# Patient Record
Sex: Male | Born: 1960 | Race: White | Hispanic: No | Marital: Married | Smoking: Never smoker
Health system: Southern US, Community
[De-identification: ages and names within clinical notes are randomized; demographics above are authoritative.]

## PROBLEM LIST (undated history)

## (undated) DIAGNOSIS — T7840XA Allergy, unspecified, initial encounter: Secondary | ICD-10-CM

## (undated) DIAGNOSIS — F419 Anxiety disorder, unspecified: Secondary | ICD-10-CM

## (undated) DIAGNOSIS — M1990 Unspecified osteoarthritis, unspecified site: Secondary | ICD-10-CM

## (undated) DIAGNOSIS — I10 Essential (primary) hypertension: Secondary | ICD-10-CM

## (undated) HISTORY — PX: VASECTOMY: SHX75

## (undated) HISTORY — DX: Essential (primary) hypertension: I10

## (undated) HISTORY — DX: Allergy, unspecified, initial encounter: T78.40XA

## (undated) HISTORY — PX: SPINE SURGERY: SHX786

## (undated) HISTORY — DX: Unspecified osteoarthritis, unspecified site: M19.90

## (undated) HISTORY — PX: CERVICAL FUSION: SHX112

## (undated) HISTORY — DX: Anxiety disorder, unspecified: F41.9

---

## 1990-06-27 HISTORY — PX: OTHER SURGICAL HISTORY: SHX169

## 2015-08-26 ENCOUNTER — Ambulatory Visit: Payer: Self-pay | Admitting: Physician Assistant

## 2015-08-31 ENCOUNTER — Ambulatory Visit: Payer: Self-pay | Admitting: Physician Assistant

## 2015-09-14 ENCOUNTER — Ambulatory Visit (INDEPENDENT_AMBULATORY_CARE_PROVIDER_SITE_OTHER): Payer: 59 | Admitting: Physician Assistant

## 2015-09-14 ENCOUNTER — Encounter: Payer: Self-pay | Admitting: Physician Assistant

## 2015-09-14 VITALS — BP 160/70 | HR 68 | Temp 98.1°F | Resp 16 | Wt 293.6 lb

## 2015-09-14 DIAGNOSIS — I10 Essential (primary) hypertension: Secondary | ICD-10-CM | POA: Insufficient documentation

## 2015-09-14 DIAGNOSIS — N411 Chronic prostatitis: Secondary | ICD-10-CM | POA: Insufficient documentation

## 2015-09-14 DIAGNOSIS — F329 Major depressive disorder, single episode, unspecified: Secondary | ICD-10-CM | POA: Diagnosis not present

## 2015-09-14 DIAGNOSIS — Z136 Encounter for screening for cardiovascular disorders: Secondary | ICD-10-CM

## 2015-09-14 DIAGNOSIS — Z13220 Encounter for screening for lipoid disorders: Secondary | ICD-10-CM

## 2015-09-14 DIAGNOSIS — Z125 Encounter for screening for malignant neoplasm of prostate: Secondary | ICD-10-CM

## 2015-09-14 DIAGNOSIS — Z0000 Encounter for general adult medical examination without abnormal findings: Secondary | ICD-10-CM | POA: Diagnosis not present

## 2015-09-14 DIAGNOSIS — M2550 Pain in unspecified joint: Secondary | ICD-10-CM

## 2015-09-14 DIAGNOSIS — F32A Depression, unspecified: Secondary | ICD-10-CM | POA: Insufficient documentation

## 2015-09-14 DIAGNOSIS — M6530 Trigger finger, unspecified finger: Secondary | ICD-10-CM | POA: Insufficient documentation

## 2015-09-14 MED ORDER — FLUOXETINE HCL 20 MG PO CAPS: 20.0000 mg | ORAL_CAPSULE | Freq: Every day | ORAL | Status: DC

## 2015-09-14 NOTE — Progress Notes (Signed)
Patient: Kyle Pollard, Male    DOB: 1961-02-02, 55 y.o.   MRN: [id number] Visit Date: 09/14/2015  Today's Provider: [name], PA-C   Chief Complaint  Patient presents with  . Establish Care   Subjective:    Establish Care:  Kyle Pollard is a 55 y.o. male who presents today to establish care. He feels well. Has frequent urination since he was 55 years old. Used to see a urologist. He is concern about having joint pain when he sits for like 15 minutes, most of the time it gets better by walking but is more frequent now. He also needs a refill for Fluoxetine. He reports that it helps him sleep better and takes one every day.  Tdap:2016 -----------------------------------------------------------------   Review of Systems  Constitutional: Negative.   HENT: Positive for congestion, sinus pressure, sneezing and voice change. Negative for ear pain, postnasal drip, rhinorrhea and sore throat.   Eyes: Positive for itching.  Respiratory: Negative.   Cardiovascular: Negative.   Gastrointestinal: Negative.   Endocrine: Positive for polyuria.  Genitourinary: Positive for frequency. Negative for decreased urine volume, discharge, penile swelling, scrotal swelling, penile pain and testicular pain.  Musculoskeletal: Positive for myalgias and arthralgias.  Skin: Negative.   Allergic/Immunologic: Positive for environmental allergies.  Neurological: Negative.   Hematological: Negative.   Psychiatric/Behavioral: Positive for sleep disturbance. Negative for dysphoric mood and decreased concentration. The patient is nervous/anxious.     Social History      He  reports that he has been smoking.  He does not have any smokeless tobacco history on file. He reports that he drinks alcohol. He reports that he does not use illicit drugs.       Social History   Social History  . Marital Status: Married    Spouse Name: N/A  . Number of Children: N/A  . Years of Education: N/A    Social History Main Topics  . Smoking status: Current Every Day Smoker  . Smokeless tobacco: None  . Alcohol Use: Yes  . Drug Use: No  . Sexual Activity: Not Asked   Other Topics Concern  . None   Social History Narrative  . None    Past Medical History  Diagnosis Date  . Allergy   . Hypertension      Patient Active Problem List   Diagnosis Date Noted  . Clinical depression 09/14/2015  . BP (high blood pressure) 09/14/2015  . Triggering of digit 09/14/2015    Past Surgical History  Procedure Laterality Date  . Broken leg  1992    Family History        Family Status  Relation Status Death Age  . Mother Alive   . Father Deceased         His family history is not on file.    Allergies  Allergen Reactions  . Penicillins Rash    Previous Medications   ASPIRIN EC 81 MG TABLET    Take by mouth. Reported on 09/14/2015   FLUOXETINE (PROZAC) 20 MG CAPSULE    TAKE ONE CAPSULE BY MOUTH EVERY DAY MUST HAVE APPT FOR MORE REFILLS   LOSARTAN-HYDROCHLOROTHIAZIDE (HYZAAR) 50-12.5 MG TABLET    Take by mouth.   MULTIPLE VITAMIN (MULTI-VITAMINS) TABS    Take by mouth.    Patient Care Team: [name], PA-C as PCP - General (Family Medicine)     Objective:   Vitals: BP 160/70 mmHg  Pulse 68  Temp(Src) 98.1 F (  36.7 C) (Oral)  Resp 16  Wt 293 lb 9.6 oz (133.176 kg)  SpO2 96%   Physical Exam  Constitutional: He is oriented to person, place, and time. He appears well-developed and well-nourished.  HENT:  Head: Normocephalic and atraumatic.  Right Ear: External ear normal.  Left Ear: External ear normal.  Nose: Nose normal.  Mouth/Throat: Oropharynx is clear and moist.  Eyes: Conjunctivae and EOM are normal. Pupils are equal, round, and reactive to light. Right eye exhibits no discharge.  Neck: Normal range of motion. Neck supple. No tracheal deviation present. No thyromegaly present.  Cardiovascular: Normal rate, regular rhythm, normal heart sounds and  intact distal pulses.   No murmur heard. Pulmonary/Chest: Effort normal and breath sounds normal. No respiratory distress. He has no wheezes. He has no rales. He exhibits no tenderness.  Abdominal: Soft. He exhibits no distension and no mass. There is no tenderness. There is no rebound and no guarding.  Genitourinary:  Patient deferred  Musculoskeletal: Normal range of motion. He exhibits no edema or tenderness.  Lymphadenopathy:    He has no cervical adenopathy.  Neurological: He is alert and oriented to person, place, and time. He has normal reflexes. No cranial nerve deficit. He exhibits normal muscle tone. Coordination normal.  Skin: Skin is warm and dry. No rash noted. No erythema.  Psychiatric: He has a normal mood and affect. His behavior is normal. Judgment and thought content normal.     Depression Screen No flowsheet data found.    Assessment & Plan:     Routine Health Maintenance and Physical Exam   1. Annual physical exam Physical exam today was normal. Patient deferred digital rectal exam and prostate exam. We will check labs as below and follow-up pending lab results. If labs are within normal limits and stable we will not need to have them repeated for one year until his next annual physical exam. He is to call the office in the meantime if he has any acute issues, questions or concerns. - TSH - CBC w/Diff/Platelet - Comprehensive Metabolic Panel (CMET)  2. Chronic prostatitis States he has had issues with urinary urgency and frequency since his early 33s. He has seen multiple urologists and states that he never saw any true benefit. He states he has been put on medications to decrease urine frequency but these medications did not help. He has been on antibiotics often for this as well stating that is the only thing that helps during the time he is on the antibiotics but as soon as he comes off of them the symptoms return. He states that he just lives with that now and  will treat whenever he has acute exacerbations but does not wish to see urology. He does try timed toileting to help with urine frequency and urgency symptoms. He is to call if he has any acute exacerbations.  3. Depression Stable. Diagnoses pulled to refill Prozac as below. Continue current medical treatment plan. - FLUoxetine (PROZAC) 20 MG capsule; Take 1 capsule (20 mg total) by mouth at bedtime.  Dispense: 90 capsule; Refill: 3  4. Encounter for lipid screening for cardiovascular disease Will check cholesterol as below being that he has not had labs checked in a while. I will follow-up with him pending these results. - Lipid panel  5. Multiple joint pain I do feel that his joint complaints seem most consistent with osteoarthritis. He states that he used to be an avid runner which would've accelerated his arthritis. I will  check labs as below to rule out any autoimmune cause for the joint pains. If labs are stable will treat as osteoarthritis. May get imaging in the future if he desires or if symptoms worsen. - Rheumatoid Factor - ANA w/Reflex if Positive - Sedimentation rate  6. Encounter for prostate cancer screening We'll check PSA since he deferred prostate and digital rectal exam today. He states that if his PSA is elevated he will then get that checked. He states that he will wait and get it done next year instead so that he can "prepare" better. - PSA   Exercise Activities and Dietary recommendations Goals    None       There is no immunization history on file for this patient.  Health Maintenance  Topic Date Due  . Hepatitis C Screening  04-Dec-1960  . HIV Screening  10/12/1973  . TETANUS/TDAP  10/12/1977  . COLONOSCOPY  10/12/2008  . INFLUENZA VACCINE  01/26/2015      Discussed health benefits of physical activity, and encouraged him to engage in regular exercise appropriate for his age and condition.      --------------------------------------------------------------------

## 2015-09-15 LAB — LIPID PANEL
CHOL/HDL RATIO: 4.7 ratio (ref 0.0–5.0)
Cholesterol, Total: 178 mg/dL (ref 100–199)
HDL: 38 mg/dL — AB (ref >39)
LDL Calculated: 113 mg/dL — ABNORMAL HIGH (ref 0–99)
Triglycerides: 135 mg/dL (ref 0–149)
VLDL Cholesterol Cal: 27 mg/dL (ref 5–40)

## 2015-09-15 LAB — COMPREHENSIVE METABOLIC PANEL
ALT: 35 IU/L (ref 0–44)
AST: 21 IU/L (ref 0–40)
Albumin/Globulin Ratio: 1.6 (ref 1.2–2.2)
Albumin: 4.4 g/dL (ref 3.5–5.5)
Alkaline Phosphatase: 73 IU/L (ref 39–117)
BUN/Creatinine Ratio: 11 (ref 9–20)
BUN: 9 mg/dL (ref 6–24)
Bilirubin Total: 0.4 mg/dL (ref 0.0–1.2)
CALCIUM: 9.7 mg/dL (ref 8.7–10.2)
CO2: 23 mmol/L (ref 18–29)
CREATININE: 0.84 mg/dL (ref 0.76–1.27)
Chloride: 101 mmol/L (ref 96–106)
GFR calc Af Amer: 113 mL/min/{1.73_m2} (ref >59)
GFR, EST NON AFRICAN AMERICAN: 98 mL/min/{1.73_m2} (ref >59)
GLOBULIN, TOTAL: 2.8 g/dL (ref 1.5–4.5)
Glucose: 98 mg/dL (ref 65–99)
Potassium: 4.5 mmol/L (ref 3.5–5.2)
Sodium: 140 mmol/L (ref 134–144)
TOTAL PROTEIN: 7.2 g/dL (ref 6.0–8.5)

## 2015-09-15 LAB — CBC WITH DIFFERENTIAL/PLATELET
Basophils Absolute: 0 10*3/uL (ref 0.0–0.2)
Basos: 1 %
EOS (ABSOLUTE): 0.2 10*3/uL (ref 0.0–0.4)
EOS: 3 %
HEMATOCRIT: 44 % (ref 37.5–51.0)
HEMOGLOBIN: 15.9 g/dL (ref 12.6–17.7)
IMMATURE GRANS (ABS): 0 10*3/uL (ref 0.0–0.1)
IMMATURE GRANULOCYTES: 0 %
LYMPHS: 39 %
Lymphocytes Absolute: 2.3 10*3/uL (ref 0.7–3.1)
MCH: 30.8 pg (ref 26.6–33.0)
MCHC: 36.1 g/dL — ABNORMAL HIGH (ref 31.5–35.7)
MCV: 85 fL (ref 79–97)
MONOCYTES: 13 %
Monocytes Absolute: 0.8 10*3/uL (ref 0.1–0.9)
NEUTROS PCT: 44 %
Neutrophils Absolute: 2.7 10*3/uL (ref 1.4–7.0)
Platelets: 239 10*3/uL (ref 150–379)
RBC: 5.16 x10E6/uL (ref 4.14–5.80)
RDW: 13.8 % (ref 12.3–15.4)
WBC: 6 10*3/uL (ref 3.4–10.8)

## 2015-09-15 LAB — SEDIMENTATION RATE: Sed Rate: 4 mm/hr (ref 0–30)

## 2015-09-15 LAB — TSH: TSH: 2.31 u[IU]/mL (ref 0.450–4.500)

## 2015-09-15 LAB — PSA: PROSTATE SPECIFIC AG, SERUM: 0.8 ng/mL (ref 0.0–4.0)

## 2015-09-15 LAB — ANA W/REFLEX IF POSITIVE: ANA: NEGATIVE

## 2015-09-15 LAB — RHEUMATOID FACTOR

## 2015-09-16 ENCOUNTER — Telehealth: Payer: Self-pay

## 2015-09-16 NOTE — Telephone Encounter (Signed)
Patient advised as directed below. Patient verbalized understanding and agrees with plan of care. Patient scheduled for 8 week follow up.

## 2015-09-16 NOTE — Telephone Encounter (Signed)
-----   Message from [name], PA-C sent at 09/16/2015  8:32 AM EDT ----- All labs are within normal limits and stable. RF, ANA and inflammatory markers were all negative. LDL (bad) cholesterol is slightly elevated at 113.  Total cholesterol is WNL. Try to add physical activity for 150 min per week per AHA guidelines. Also limit fatty foods and foods high in cholesterol from diet.  Since all labs are WNL we can f/u in 6-8 weeks to discuss the joint pains in more detail if you would like. Thanks! -JB

## 2015-09-18 ENCOUNTER — Other Ambulatory Visit: Payer: Self-pay | Admitting: Physician Assistant

## 2015-09-18 DIAGNOSIS — I10 Essential (primary) hypertension: Secondary | ICD-10-CM

## 2015-11-11 ENCOUNTER — Ambulatory Visit (INDEPENDENT_AMBULATORY_CARE_PROVIDER_SITE_OTHER): Payer: 59 | Admitting: Physician Assistant

## 2015-11-11 ENCOUNTER — Encounter: Payer: Self-pay | Admitting: Physician Assistant

## 2015-11-11 VITALS — BP 140/80 | HR 79 | Temp 98.5°F | Resp 16 | Wt 291.0 lb

## 2015-11-11 DIAGNOSIS — M159 Polyosteoarthritis, unspecified: Secondary | ICD-10-CM | POA: Insufficient documentation

## 2015-11-11 DIAGNOSIS — M150 Primary generalized (osteo)arthritis: Secondary | ICD-10-CM

## 2015-11-11 MED ORDER — MELOXICAM 15 MG PO TABS: 15.0000 mg | ORAL_TABLET | Freq: Every day | ORAL | Status: DC

## 2015-11-11 NOTE — Progress Notes (Signed)
       Patient: Kyle Pollard Male    DOB: [birth year]   55 y.o.   MRN: [id number] Visit Date: 11/11/2015  Today's Provider: [name], PA-C   Chief Complaint  Patient presents with  . Follow-up    Joint pain   Subjective:    HPI Joint/Muscle Pain: Patient following on  arthralgias for which has been present for several months. Pain is located in multiple joints, is described as very stiff, and is moderate .  Associated symptoms include: tenderness in quadriceps (pulling sensation when standing after sitting for a while or after activity). Labs were obtained at last office visit and came back WNL. Negative for RF, ANA and Inflammatory markers. He reports that when he goes for walk he is getting right tightness and aching after or the next day. Notices mostly in ankles and knees (left knee is the worst). Has history of cortisone injections for trigger finger (bilaterally) and in his left foot.     Allergies  Allergen Reactions  . Penicillins Rash   Previous Medications   ASPIRIN EC 81 MG TABLET    Take by mouth. Reported on 11/11/2015   FLUOXETINE (PROZAC) 20 MG CAPSULE    Take 1 capsule (20 mg total) by mouth at bedtime.   LOSARTAN-HYDROCHLOROTHIAZIDE (HYZAAR) 50-12.5 MG TABLET    TAKE 1 TABLET BY MOUTH ONCE DAILY.   MULTIPLE VITAMIN (MULTI-VITAMINS) TABS    Take by mouth.    Review of Systems  Constitutional: Negative for fatigue.  Respiratory: Negative for cough, chest tightness and shortness of breath.   Cardiovascular: Negative for chest pain, palpitations and leg swelling.  Gastrointestinal: Negative for abdominal pain.  Musculoskeletal: Positive for myalgias and arthralgias. Negative for back pain, joint swelling and gait problem.  Neurological: Negative for weakness.    Social History  Substance Use Topics  . Smoking status: Current Every Day Smoker  . Smokeless tobacco: Not on file     Comment: Tobacco  . Alcohol Use: Yes   Objective:   BP 140/80 mmHg   Pulse 79  Temp(Src) 98.5 F (36.9 C) (Oral)  Resp 16  Wt 291 lb (131.997 kg)  Physical Exam  Constitutional: He appears well-developed and well-nourished. No distress.  HENT:  Head: Normocephalic and atraumatic.  Neck: Normal range of motion. Neck supple. No tracheal deviation present. No thyromegaly present.  Cardiovascular: Normal rate, regular rhythm and normal heart sounds.  Exam reveals no gallop and no friction rub.   No murmur heard. Pulmonary/Chest: Effort normal and breath sounds normal. No respiratory distress. He has no wheezes. He has no rales.  Musculoskeletal: Normal range of motion. He exhibits no edema or tenderness.  Lymphadenopathy:    He has no cervical adenopathy.  Skin: He is not diaphoretic.  Vitals reviewed.       Assessment & Plan:     1. Primary osteoarthritis involving multiple joints I do feel his symptoms are related to OA. Will try meloxicam as needed for the pain. He is to call if medication does not give relief. May consider cortisone injection in left and/or right knee during flare to see if relief is obtained.  - meloxicam (MOBIC) 15 MG tablet; Take 1 tablet (15 mg total) by mouth daily.  Dispense: 30 tablet; Refill: [place], PA-C  [facility]

## 2015-11-11 NOTE — Patient Instructions (Signed)

## 2016-01-09 ENCOUNTER — Other Ambulatory Visit: Payer: Self-pay | Admitting: Physician Assistant

## 2016-01-14 ENCOUNTER — Other Ambulatory Visit: Payer: Self-pay | Admitting: Physician Assistant

## 2016-03-19 ENCOUNTER — Other Ambulatory Visit: Payer: Self-pay | Admitting: Physician Assistant

## 2016-06-23 ENCOUNTER — Telehealth: Payer: Self-pay | Admitting: Physician Assistant

## 2016-06-23 MED ORDER — MELOXICAM 15 MG PO TABS: ORAL_TABLET | ORAL | 1 refills | Status: DC

## 2016-06-23 NOTE — Telephone Encounter (Signed)
Meloxicam 15 mg refilled. 

## 2016-07-25 ENCOUNTER — Encounter: Payer: Self-pay | Admitting: Family Medicine

## 2016-07-25 ENCOUNTER — Ambulatory Visit (INDEPENDENT_AMBULATORY_CARE_PROVIDER_SITE_OTHER): Payer: 59 | Admitting: Family Medicine

## 2016-07-25 VITALS — BP 112/80 | HR 72 | Temp 98.6°F | Resp 14 | Wt 269.0 lb

## 2016-07-25 DIAGNOSIS — N451 Epididymitis: Secondary | ICD-10-CM | POA: Diagnosis not present

## 2016-07-25 MED ORDER — SULFAMETHOXAZOLE-TRIMETHOPRIM 800-160 MG PO TABS: 1.0000 | ORAL_TABLET | Freq: Two times a day (BID) | ORAL | 0 refills | Status: DC

## 2016-07-25 NOTE — Progress Notes (Signed)
   Kyle Pollard  MRN: [id number] DOB: March 13, 1961  Subjective:  HPI  Patient is here to discuss testicular pain/more of an ache present for the past few days. Bilateral. No swelling or redness present, no rash. No urine discomfort or fever or nausea. He does states in case this has an effect his wife has yeast infection right now. Patient Active Problem List   Diagnosis Date Noted  . Primary osteoarthritis involving multiple joints 11/11/2015  . Clinical depression 09/14/2015  . BP (high blood pressure) 09/14/2015  . Triggering of digit 09/14/2015  . Chronic prostatitis 09/14/2015    Past Medical History:  Diagnosis Date  . Allergy   . Hypertension     Social History   Social History  . Marital status: Married    Spouse name: N/A  . Number of children: N/A  . Years of education: N/A   Occupational History  . Not on file.   Social History Main Topics  . Smoking status: Never Smoker  . Smokeless tobacco: Current User     Comment: Tobacco-dips  . Alcohol use Yes  . Drug use: No  . Sexual activity: Not on file   Other Topics Concern  . Not on file   Social History Narrative  . No narrative on file    Outpatient Encounter Prescriptions as of 07/25/2016  Medication Sig Note  . FLUoxetine (PROZAC) 20 MG capsule Take 1 capsule (20 mg total) by mouth at bedtime.   Marland Kitchen losartan-hydrochlorothiazide (HYZAAR) 50-12.5 MG tablet TAKE 1 TABLET BY MOUTH ONCE DAILY.   . meloxicam (MOBIC) 15 MG tablet TAKE 1 TABLET (15 MG TOTAL) BY MOUTH DAILY. 07/25/2016: prn  . Multiple Vitamin (MULTI-VITAMINS) TABS Take by mouth. 09/14/2015: Received from: [facility]  . aspirin EC 81 MG tablet Take by mouth. Reported on 11/11/2015 09/14/2015: Received from: [facility]   No facility-administered encounter medications on file as of 07/25/2016.     Allergies  Allergen Reactions  . Penicillins Rash    Review of Systems  Constitutional: Negative.     Respiratory: Negative.   Cardiovascular: Negative.   Genitourinary: Positive for frequency (chronic).       Testicular ache/pain-bilateral  Musculoskeletal: Negative.     Objective:  BP 112/80   Pulse 72   Temp 98.6 F (37 C)   Resp 14   Wt 269 lb (122 kg)   Physical Exam  Constitutional: He is oriented to person, place, and time and well-developed, well-nourished, and in no distress.  HENT:  Head: Normocephalic and atraumatic.  Eyes: Conjunctivae are normal. No scleral icterus.  Neck: No thyromegaly present.  Cardiovascular: Normal rate and regular rhythm.   Pulmonary/Chest: Effort normal.  Abdominal: Soft.  Genitourinary:  Genitourinary Comments: Epididymis mildly swollen and tender bilaterally.  Neurological: He is alert and oriented to person, place, and time.  Skin: Skin is warm and dry.  Psychiatric: Mood, memory, affect and judgment normal.    Assessment and Plan :   Epididymitis Treatment with Septra DS. Refer to urology if it worsens.

## 2016-07-27 ENCOUNTER — Encounter: Payer: Self-pay | Admitting: Family Medicine

## 2016-11-13 ENCOUNTER — Other Ambulatory Visit: Payer: Self-pay | Admitting: Physician Assistant

## 2016-11-13 DIAGNOSIS — I10 Essential (primary) hypertension: Secondary | ICD-10-CM

## 2016-11-14 NOTE — Telephone Encounter (Signed)
Gave 30 days, needs office visit before more refills.

## 2016-11-14 NOTE — Telephone Encounter (Signed)
Please review, jennis patient-aa

## 2016-11-15 NOTE — Telephone Encounter (Signed)
Patient advised and appointment made for CPE in June, that was the first available at 65 am-aa

## 2016-11-22 ENCOUNTER — Other Ambulatory Visit: Payer: Self-pay | Admitting: Physician Assistant

## 2016-11-22 DIAGNOSIS — F329 Major depressive disorder, single episode, unspecified: Secondary | ICD-10-CM

## 2016-11-22 DIAGNOSIS — F32A Depression, unspecified: Secondary | ICD-10-CM

## 2016-12-19 ENCOUNTER — Other Ambulatory Visit: Payer: Self-pay | Admitting: Physician Assistant

## 2016-12-19 DIAGNOSIS — I10 Essential (primary) hypertension: Secondary | ICD-10-CM

## 2016-12-19 MED ORDER — LOSARTAN POTASSIUM-HCTZ 50-12.5 MG PO TABS: 1.0000 | ORAL_TABLET | Freq: Every day | ORAL | 1 refills | Status: DC

## 2016-12-19 NOTE — Telephone Encounter (Signed)
[facility] pharmacy faxed a request for a 90-days supply for the following medication. Thanks CC  losartan-hydrochlorothiazide (HYZAAR) 50-12.5 MG tablet  >Take 1 tablet by mouth every day.

## 2016-12-22 ENCOUNTER — Encounter: Payer: Self-pay | Admitting: Physician Assistant

## 2016-12-22 ENCOUNTER — Ambulatory Visit (INDEPENDENT_AMBULATORY_CARE_PROVIDER_SITE_OTHER): Payer: 59 | Admitting: Physician Assistant

## 2016-12-22 VITALS — BP 132/90 | HR 72 | Temp 98.3°F | Ht 72.0 in | Wt 281.6 lb

## 2016-12-22 DIAGNOSIS — R350 Frequency of micturition: Secondary | ICD-10-CM | POA: Diagnosis not present

## 2016-12-22 DIAGNOSIS — J0130 Acute sphenoidal sinusitis, unspecified: Secondary | ICD-10-CM | POA: Diagnosis not present

## 2016-12-22 DIAGNOSIS — E785 Hyperlipidemia, unspecified: Secondary | ICD-10-CM | POA: Diagnosis not present

## 2016-12-22 DIAGNOSIS — Z833 Family history of diabetes mellitus: Secondary | ICD-10-CM

## 2016-12-22 DIAGNOSIS — F3341 Major depressive disorder, recurrent, in partial remission: Secondary | ICD-10-CM

## 2016-12-22 DIAGNOSIS — I10 Essential (primary) hypertension: Secondary | ICD-10-CM

## 2016-12-22 DIAGNOSIS — Z0000 Encounter for general adult medical examination without abnormal findings: Secondary | ICD-10-CM

## 2016-12-22 DIAGNOSIS — Z1159 Encounter for screening for other viral diseases: Secondary | ICD-10-CM

## 2016-12-22 DIAGNOSIS — M170 Bilateral primary osteoarthritis of knee: Secondary | ICD-10-CM

## 2016-12-22 DIAGNOSIS — Z1211 Encounter for screening for malignant neoplasm of colon: Secondary | ICD-10-CM | POA: Diagnosis not present

## 2016-12-22 DIAGNOSIS — Z6838 Body mass index (BMI) 38.0-38.9, adult: Secondary | ICD-10-CM | POA: Diagnosis not present

## 2016-12-22 DIAGNOSIS — Z125 Encounter for screening for malignant neoplasm of prostate: Secondary | ICD-10-CM | POA: Diagnosis not present

## 2016-12-22 DIAGNOSIS — N411 Chronic prostatitis: Secondary | ICD-10-CM

## 2016-12-22 MED ORDER — FLUOXETINE HCL 20 MG PO CAPS: 20.0000 mg | ORAL_CAPSULE | Freq: Every day | ORAL | 1 refills | Status: DC

## 2016-12-22 MED ORDER — LOSARTAN POTASSIUM-HCTZ 50-12.5 MG PO TABS: 1.0000 | ORAL_TABLET | Freq: Every day | ORAL | 1 refills | Status: DC

## 2016-12-22 MED ORDER — OXYBUTYNIN CHLORIDE ER 5 MG PO TB24: 5.0000 mg | ORAL_TABLET | Freq: Every day | ORAL | 1 refills | Status: DC

## 2016-12-22 MED ORDER — SULFAMETHOXAZOLE-TRIMETHOPRIM 800-160 MG PO TABS: 1.0000 | ORAL_TABLET | Freq: Two times a day (BID) | ORAL | 0 refills | Status: DC

## 2016-12-22 MED ORDER — MELOXICAM 15 MG PO TABS
ORAL_TABLET | ORAL | 1 refills | Status: DC
Start: 2016-12-22 — End: 2016-12-27

## 2016-12-22 NOTE — Progress Notes (Signed)
Patient: Kyle Pollard, Male    DOB: [birth year], 58 y.o.   MRN: [id number] Visit Date: 12/22/2016  Today's Provider: [name], PA-C   Chief Complaint  Patient presents with  . Annual Exam   Subjective:    Annual physical exam Kyle Pollard is a 56 y.o. male who presents today for health maintenance and complete physical. He feels fairly well. He reports exercising 3 times a week, but limited due to knee pain. He reports he is sleeping fairly well except awakening to urinate.  Patient would like to discuss some sinus congestion issues. The issues have been ongoing for 1 month. He states he is congested and unable to blow his nose.  ----------------------------------------------------------------- CPE- 09/14/2015 PSA-0.8  09/14/2015  Tdap: 2016 patient reported   Review of Systems  Constitutional: Negative.   HENT: Positive for congestion and ear discharge.   Eyes: Negative.   Respiratory: Negative.   Cardiovascular: Negative.   Gastrointestinal: Negative.   Endocrine: Negative.   Genitourinary: Negative.   Musculoskeletal: Positive for arthralgias.  Skin: Negative.   Allergic/Immunologic: Negative.   Neurological: Negative.   Hematological: Negative.   Psychiatric/Behavioral: Negative.     Social History      He  reports that he has never smoked. He uses smokeless tobacco. He reports that he drinks alcohol. He reports that he does not use drugs.       Social History   Social History  . Marital status: Married    Spouse name: N/A  . Number of children: N/A  . Years of education: N/A   Social History Main Topics  . Smoking status: Never Smoker  . Smokeless tobacco: Current User     Comment: Tobacco-dips  . Alcohol use Yes  . Drug use: No  . Sexual activity: Not Asked   Other Topics Concern  . None   Social History Narrative  . None    Past Medical History:  Diagnosis Date  . Allergy   . Hypertension      Patient Active Problem List     Diagnosis Date Noted  . Primary osteoarthritis involving multiple joints 11/11/2015  . Clinical depression 09/14/2015  . BP (high blood pressure) 09/14/2015  . Triggering of digit 09/14/2015  . Chronic prostatitis 09/14/2015    Past Surgical History:  Procedure Laterality Date  . Broken Leg  1992    Family History        Family Status  Relation Status  . Mother Alive  . Father Deceased        His family history is not on file.     Allergies  Allergen Reactions  . Penicillins Rash     Current Outpatient Prescriptions:  .  aspirin EC 81 MG tablet, Take by mouth. Reported on 11/11/2015, Disp: , Rfl:  .  FLUoxetine (PROZAC) 20 MG capsule, TAKE 1 CAPSULE (20 MG TOTAL) BY MOUTH AT BEDTIME., Disp: 90 capsule, Rfl: 3 .  losartan-hydrochlorothiazide (HYZAAR) 50-12.5 MG tablet, Take 1 tablet by mouth daily., Disp: 90 tablet, Rfl: 1 .  meloxicam (MOBIC) 15 MG tablet, TAKE 1 TABLET (15 MG TOTAL) BY MOUTH DAILY., Disp: 90 tablet, Rfl: 1 .  Multiple Vitamin (MULTI-VITAMINS) TABS, Take by mouth., Disp: , Rfl:    Patient Care Team: [name], PA-C as PCP - General (Family Medicine)      Objective:   Vitals: BP 132/90 (BP Location: Left Arm, Patient Position: Sitting, Cuff Size: Large)   Pulse 72  Temp 98.3 F (36.8 C) (Oral)   Ht 6' (1.829 m)   Wt 281 lb 9.6 oz (127.7 kg)   SpO2 96%   BMI 38.19 kg/m    Vitals:   12/22/16 0908  BP: 132/90  Pulse: 72  Temp: 98.3 F (36.8 C)  TempSrc: Oral  SpO2: 96%  Weight: 281 lb 9.6 oz (127.7 kg)  Height: 6' (1.829 m)     Physical Exam  Constitutional: He is oriented to person, place, and time. He appears well-developed and well-nourished.  HENT:  Head: Normocephalic and atraumatic.  Right Ear: Hearing, tympanic membrane, external ear and ear canal normal.  Left Ear: Hearing, tympanic membrane, external ear and ear canal normal.  Nose: Nose normal.  Mouth/Throat: Uvula is midline and mucous membranes are normal.  Posterior oropharyngeal erythema present.  Eyes: Conjunctivae and EOM are normal. Pupils are equal, round, and reactive to light. Right eye exhibits no discharge.  Neck: Normal range of motion. Neck supple. No JVD present. Carotid bruit is not present. No tracheal deviation present. No thyromegaly present.  Cardiovascular: Normal rate, regular rhythm, normal heart sounds and intact distal pulses.   No murmur heard. Pulmonary/Chest: Effort normal and breath sounds normal. No respiratory distress. He has no wheezes. He has no rales. He exhibits no tenderness.  Abdominal: Soft. He exhibits no distension and no mass. There is no tenderness. There is no rebound and no guarding.  Genitourinary: Rectum normal, prostate normal, testes normal and penis normal. Rectal exam shows no external hemorrhoid, no fissure, no mass, no tenderness and anal tone normal. Prostate is not enlarged and not tender. Right testis shows no mass. Left testis shows no mass. No penile tenderness. No discharge found.     Musculoskeletal: Normal range of motion. He exhibits no edema or tenderness.  Lymphadenopathy:    He has no cervical adenopathy.  Neurological: He is alert and oriented to person, place, and time. He has normal reflexes. No cranial nerve deficit. He exhibits normal muscle tone. Coordination normal.  Skin: Skin is warm and dry. No rash noted. No erythema.  Psychiatric: He has a normal mood and affect. His behavior is normal. Judgment and thought content normal.  Vitals reviewed.    Depression Screen PHQ 2/9 Scores 12/22/2016  PHQ - 2 Score 0  PHQ- 9 Score 0      Assessment & Plan:     Routine Health Maintenance and Physical Exam  Exercise Activities and Dietary recommendations Goals    None       There is no immunization history on file for this patient.  Health Maintenance  Topic Date Due  . Hepatitis C Screening  [birth year]  . HIV Screening  10/12/1973  . TETANUS/TDAP  10/12/1977  .  COLONOSCOPY  10/12/2008  . INFLUENZA VACCINE  01/25/2017     Discussed health benefits of physical activity, and encouraged him to engage in regular exercise appropriate for his age and condition.   1. Annual physical exam Normal physical exam today. Will check labs as below and f/u pending lab results. If labs are stable and WNL he will not need to have these rechecked for one year at his next annual physical exam. He is to call the office in the meantime if he has any acute issue, questions or concerns. - CBC w/Diff/Platelet - Comprehensive Metabolic Panel (CMET) - TSH  2. Colon cancer screening Patient can not remember when he had his 1st colonoscopy. Thinks he had just before turning 48. None on  file. He will get colonoscopy next year.  3. Chronic prostatitis Prostate exam normal today. Will check labs as below and f/u pending results. - PSA  4. Screening for prostate cancer Will check labs as below and f/u pending results. - PSA  5. Urine frequency Will give trial of oxybutynin as below for frequent nocturia (patient gets up every 2-3 hours). He is to call if he has any adverse reaction or if medication is ineffective.  - oxybutynin (DITROPAN-XL) 5 MG 24 hr tablet; Take 1 tablet (5 mg total) by mouth at bedtime.  Dispense: 90 tablet; Refill: 1  6. Primary osteoarthritis of both knees Stable. Diagnosis pulled for medication refill. Continue current medical treatment plan. Trying to lose weight with walking, jogging and swimming. Discussed jogging causing worsening knee pain, however, he states this is his exercise of choice and feels more relaxed with running.  - meloxicam (MOBIC) 15 MG tablet; TAKE 1 TABLET (15 MG TOTAL) BY MOUTH DAILY.  Dispense: 90 tablet; Refill: 1  7. Essential hypertension Stable. Diagnosis pulled for medication refill. Continue current medical treatment plan. - losartan-hydrochlorothiazide (HYZAAR) 50-12.5 MG tablet; Take 1 tablet by mouth daily.   Dispense: 90 tablet; Refill: 1 - CBC w/Diff/Platelet - Comprehensive Metabolic Panel (CMET)  8. Recurrent major depressive disorder, in partial remission (HCC) Stable. Diagnosis pulled for medication refill. Continue current medical treatment plan. - FLUoxetine (PROZAC) 20 MG capsule; Take 1 capsule (20 mg total) by mouth at bedtime.  Dispense: 90 capsule; Refill: 1  9. Hyperlipidemia, unspecified hyperlipidemia type LDL last year was borderline high at 113. Will check labs as below and f/u pending results. - Lipid Profile  10. Family history of diabetes mellitus Will check labs as below and f/u pending results. - HgB A1c  11. BMI 38.0-38.9,adult Counseled patient on healthy lifestyle modifications including dieting and exercise.  - HgB A1c  12. Acute sphenoidal sinusitis, recurrence not specified Worsening symptoms. Will give bactrim as below since patient allergic to penicillins. He is to call if no improvement.  - sulfamethoxazole-trimethoprim (BACTRIM DS,SEPTRA DS) 800-160 MG tablet; Take 1 tablet by mouth 2 (two) times daily.  Dispense: 14 tablet; Refill: 0  13. Need for hepatitis C screening test - Hepatitis C Antibody   --------------------------------------------------------------------    [name], PA-C  [facility]

## 2016-12-22 NOTE — Patient Instructions (Signed)
Health Maintenance, Male A healthy lifestyle and preventive care is important for your health and wellness. Ask your health care provider about what schedule of regular examinations is right for you. What should I know about weight and diet? Eat a Healthy Diet  Eat plenty of vegetables, fruits, whole grains, low-fat dairy products, and lean protein.  Do not eat a lot of foods high in solid fats, added sugars, or salt.  Maintain a Healthy Weight Regular exercise can help you achieve or maintain a healthy weight. You should:  Do at least 150 minutes of exercise each week. The exercise should increase your heart rate and make you sweat (moderate-intensity exercise).  Do strength-training exercises at least twice a week.  Watch Your Levels of Cholesterol and Blood Lipids  Have your blood tested for lipids and cholesterol every 5 years starting at 58 years of age. If you are at high risk for heart disease, you should start having your blood tested when you are 58 years old. You may need to have your cholesterol levels checked more often if: ? Your lipid or cholesterol levels are high. ? You are older than 58 years of age. ? You are at high risk for heart disease.  What should I know about cancer screening? Many types of cancers can be detected early and may often be prevented. Lung Cancer  You should be screened every year for lung cancer if: ? You are a current smoker who has smoked for at least 30 years. ? You are a former smoker who has quit within the past 15 years.  Talk to your health care provider about your screening options, when you should start screening, and how often you should be screened.  Colorectal Cancer  Routine colorectal cancer screening usually begins at 58 years of age and should be repeated every 5-10 years until you are 58 years old. You may need to be screened more often if early forms of precancerous polyps or small growths are found. Your health care provider  may recommend screening at an earlier age if you have risk factors for colon cancer.  Your health care provider may recommend using home test kits to check for hidden blood in the stool.  A small camera at the end of a tube can be used to examine your colon (sigmoidoscopy or colonoscopy). This checks for the earliest forms of colorectal cancer.  Prostate and Testicular Cancer  Depending on your age and overall health, your health care provider may do certain tests to screen for prostate and testicular cancer.  Talk to your health care provider about any symptoms or concerns you have about testicular or prostate cancer.  Skin Cancer  Check your skin from head to toe regularly.  Tell your health care provider about any new moles or changes in moles, especially if: ? There is a change in a mole's size, shape, or color. ? You have a mole that is larger than a pencil eraser.  Always use sunscreen. Apply sunscreen liberally and repeat throughout the day.  Protect yourself by wearing long sleeves, pants, a wide-brimmed hat, and sunglasses when outside.  What should I know about heart disease, diabetes, and high blood pressure?  If you are 18-39 years of age, have your blood pressure checked every 3-5 years. If you are 40 years of age or older, have your blood pressure checked every year. You should have your blood pressure measured twice-once when you are at a hospital or clinic, and once when   you are not at a hospital or clinic. Record the average of the two measurements. To check your blood pressure when you are not at a hospital or clinic, you can use: ? An automated blood pressure machine at a pharmacy. ? A home blood pressure monitor.  Talk to your health care provider about your target blood pressure.  If you are between 88-17 years old, ask your health care provider if you should take aspirin to prevent heart disease.  Have regular diabetes screenings by checking your fasting blood  sugar level. ? If you are at a normal weight and have a low risk for diabetes, have this test once every three years after the age of 15. ? If you are overweight and have a high risk for diabetes, consider being tested at a younger age or more often.  A one-time screening for abdominal aortic aneurysm (AAA) by ultrasound is recommended for men aged 83-75 years who are current or former smokers. What should I know about preventing infection? Hepatitis B If you have a higher risk for hepatitis B, you should be screened for this virus. Talk with your health care provider to find out if you are at risk for hepatitis B infection. Hepatitis C Blood testing is recommended for:  Everyone born from [birth year] through 1967.  Anyone with known risk factors for hepatitis C.  Sexually Transmitted Diseases (STDs)  You should be screened each year for STDs including gonorrhea and chlamydia if: ? You are sexually active and are younger than 58 years of age. ? You are older than 58 years of age and your health care provider tells you that you are at risk for this type of infection. ? Your sexual activity has changed since you were last screened and you are at an increased risk for chlamydia or gonorrhea. Ask your health care provider if you are at risk.  Talk with your health care provider about whether you are at high risk of being infected with HIV. Your health care provider may recommend a prescription medicine to help prevent HIV infection.  What else can I do?  Schedule regular health, dental, and eye exams.  Stay current with your vaccines (immunizations).  Do not use any tobacco products, such as cigarettes, chewing tobacco, and e-cigarettes. If you need help quitting, ask your health care provider.  Limit alcohol intake to no more than 2 drinks per day. One drink equals 12 ounces of beer, 5 ounces of wine, or 1 ounces of hard liquor.  Do not use street drugs.  Do not share needles.  Ask your  health care provider for help if you need support or information about quitting drugs.  Tell your health care provider if you often feel depressed.  Tell your health care provider if you have ever been abused or do not feel safe at home. This information is not intended to replace advice given to you by your health care provider. Make sure you discuss any questions you have with your health care provider. Document Released: 12/10/2007 Document Revised: 02/10/2016 Document Reviewed: 03/17/2015 Elsevier Interactive Patient Education  2018 [employer]. Oxybutynin extended-release tablets What is this medicine? OXYBUTYNIN (ox i BYOO ti nin) is used to treat overactive bladder. This medicine reduces the amount of bathroom visits. It may also help to control wetting accidents. This medicine may be used for other purposes; ask your health care provider or pharmacist if you have questions. COMMON BRAND NAME(S): Ditropan XL What should I tell my health  care provider before I take this medicine? They need to know if you have any of these conditions: -autonomic neuropathy -dementia -difficulty passing urine -glaucoma -intestinal obstruction -kidney disease -liver disease -myasthenia gravis -Parkinson's disease -an unusual or allergic reaction to oxybutynin, other medicines, foods, dyes, or preservatives -pregnant or trying to get pregnant -breast-feeding How should I use this medicine? Take this medicine by mouth with a glass of water. Swallow whole, do not crush, cut, or chew. Follow the directions on the prescription label. You can take this medicine with or without food. Take your doses at regular intervals. Do not take your medicine more often than directed. Talk to your pediatrician regarding the use of this medicine in children. Special care may be needed. While this drug may be prescribed for children as young as 6 years for selected conditions, precautions do apply. Overdosage: If you  think you have taken too much of this medicine contact a poison control center or emergency room at once. NOTE: This medicine is only for you. Do not share this medicine with others. What if I miss a dose? If you miss a dose, take it as soon as you can. If it is almost time for your next dose, take only that dose. Do not take double or extra doses. What may interact with this medicine? -antihistamines for allergy, cough and cold -atropine -certain medicines for bladder problems like oxybutynin, tolterodine -certain medicines for Parkinson's disease like benztropine, trihexyphenidyl -certain medicines for stomach problems like dicyclomine, hyoscyamine -certain medicines for travel sickness like scopolamine -clarithromycin -erythromycin -ipratropium -medicines for fungal infections, like fluconazole, itraconazole, ketoconazole or voriconazole This list may not describe all possible interactions. Give your health care provider a list of all the medicines, herbs, non-prescription drugs, or dietary supplements you use. Also tell them if you smoke, drink alcohol, or use illegal drugs. Some items may interact with your medicine. What should I watch for while using this medicine? It may take a few weeks to notice the full benefit from this medicine. You may need to limit your intake tea, coffee, caffeinated sodas, and alcohol. These drinks may make your symptoms worse. You may get drowsy or dizzy. Do not drive, use machinery, or do anything that needs mental alertness until you know how this medicine affects you. Do not stand or sit up quickly, especially if you are an older patient. This reduces the risk of dizzy or fainting spells. Alcohol may interfere with the effect of this medicine. Avoid alcoholic drinks. Your mouth may get dry. Chewing sugarless gum or sucking hard candy, and drinking plenty of water may help. Contact your doctor if the problem does not go away or is severe. This medicine may  cause dry eyes and blurred vision. If you wear contact lenses, you may feel some discomfort. Lubricating drops may help. See your eyecare professional if the problem does not go away or is severe. You may notice the shells of the tablets in your stool from time to time. This is normal. Avoid extreme heat. This medicine can cause you to sweat less than normal. Your body temperature could increase to dangerous levels, which may lead to heat stroke. What side effects may I notice from receiving this medicine? Side effects that you should report to your doctor or health care professional as soon as possible: -allergic reactions like skin rash, itching or hives, swelling of the face, lips, or tongue -agitation -breathing problems -confusion -fever -flushing (reddening of the skin) -hallucinations -memory loss -pain or  difficulty passing urine -palpitations -unusually weak or tired Side effects that usually do not require medical attention (report to your doctor or health care professional if they continue or are bothersome): -constipation -headache -sexual difficulties (impotence) This list may not describe all possible side effects. Call your doctor for medical advice about side effects. You may report side effects to FDA at 1-800-FDA-1088. Where should I keep my medicine? Keep out of the reach of children. Store at room temperature between 15 and 30 degrees C (59 and 86 degrees F). Protect from moisture and humidity. Throw away any unused medicine after the expiration date. NOTE: This sheet is a summary. It may not cover all possible information. If you have questions about this medicine, talk to your doctor, pharmacist, or health care provider.  2018 Elsevier/Gold Standard (2013-08-29 10:57:06)

## 2016-12-23 ENCOUNTER — Telehealth: Payer: Self-pay

## 2016-12-23 LAB — CBC WITH DIFFERENTIAL/PLATELET
BASOS ABS: 0 10*3/uL (ref 0.0–0.2)
Basos: 1 %
EOS (ABSOLUTE): 0.3 10*3/uL (ref 0.0–0.4)
Eos: 6 %
HEMOGLOBIN: 15.4 g/dL (ref 13.0–17.7)
Hematocrit: 43.3 % (ref 37.5–51.0)
IMMATURE GRANS (ABS): 0 10*3/uL (ref 0.0–0.1)
Immature Granulocytes: 0 %
LYMPHS ABS: 1.9 10*3/uL (ref 0.7–3.1)
LYMPHS: 40 %
MCH: 30.8 pg (ref 26.6–33.0)
MCHC: 35.6 g/dL (ref 31.5–35.7)
MCV: 87 fL (ref 79–97)
MONOCYTES: 12 %
Monocytes Absolute: 0.6 10*3/uL (ref 0.1–0.9)
Neutrophils Absolute: 2 10*3/uL (ref 1.4–7.0)
Neutrophils: 41 %
PLATELETS: 220 10*3/uL (ref 150–379)
RBC: 5 x10E6/uL (ref 4.14–5.80)
RDW: 14.3 % (ref 12.3–15.4)
WBC: 4.8 10*3/uL (ref 3.4–10.8)

## 2016-12-23 LAB — COMPREHENSIVE METABOLIC PANEL
ALBUMIN: 4.1 g/dL (ref 3.5–5.5)
ALT: 25 IU/L (ref 0–44)
AST: 19 IU/L (ref 0–40)
Albumin/Globulin Ratio: 1.5 (ref 1.2–2.2)
Alkaline Phosphatase: 56 IU/L (ref 39–117)
BILIRUBIN TOTAL: 0.4 mg/dL (ref 0.0–1.2)
BUN / CREAT RATIO: 19 (ref 9–20)
BUN: 17 mg/dL (ref 6–24)
CHLORIDE: 103 mmol/L (ref 96–106)
CO2: 20 mmol/L (ref 20–29)
CREATININE: 0.88 mg/dL (ref 0.76–1.27)
Calcium: 9.2 mg/dL (ref 8.7–10.2)
GFR calc Af Amer: 109 mL/min/{1.73_m2} (ref >59)
GFR calc non Af Amer: 95 mL/min/{1.73_m2} (ref >59)
GLUCOSE: 102 mg/dL — AB (ref 65–99)
Globulin, Total: 2.8 g/dL (ref 1.5–4.5)
Potassium: 4.3 mmol/L (ref 3.5–5.2)
Sodium: 140 mmol/L (ref 134–144)
Total Protein: 6.9 g/dL (ref 6.0–8.5)

## 2016-12-23 LAB — LIPID PANEL
CHOLESTEROL TOTAL: 174 mg/dL (ref 100–199)
Chol/HDL Ratio: 3.9 ratio (ref 0.0–5.0)
HDL: 45 mg/dL (ref >39)
LDL Calculated: 95 mg/dL (ref 0–99)
TRIGLYCERIDES: 169 mg/dL — AB (ref 0–149)
VLDL CHOLESTEROL CAL: 34 mg/dL (ref 5–40)

## 2016-12-23 LAB — HEMOGLOBIN A1C
Est. average glucose Bld gHb Est-mCnc: 123 mg/dL
Hgb A1c MFr Bld: 5.9 % — ABNORMAL HIGH (ref 4.8–5.6)

## 2016-12-23 LAB — PSA: Prostate Specific Ag, Serum: 1.4 ng/mL (ref 0.0–4.0)

## 2016-12-23 LAB — HEPATITIS C ANTIBODY: Hep C Virus Ab: <0.1 s/co ratio (ref 0.0–0.9)

## 2016-12-23 LAB — TSH: TSH: 1.52 u[IU]/mL (ref 0.450–4.500)

## 2016-12-23 NOTE — Telephone Encounter (Signed)
-----   Message from [name], [place] sent at 12/23/2016  8:43 AM EDT ----- All labs are normal with exception of A1c which is borderline high at 5.9 indicating prediabetes. Continue working on healthy lifestyle modifications as we discussed in the office visit with healthy dieting and continuing to increase physical activity.

## 2016-12-23 NOTE — Telephone Encounter (Signed)
Patient advised as directed below.  Thanks,  -Joseline 

## 2016-12-27 ENCOUNTER — Other Ambulatory Visit: Payer: Self-pay | Admitting: Physician Assistant

## 2016-12-30 ENCOUNTER — Telehealth: Payer: Self-pay

## 2016-12-30 DIAGNOSIS — N401 Enlarged prostate with lower urinary tract symptoms: Secondary | ICD-10-CM

## 2016-12-30 DIAGNOSIS — R351 Nocturia: Principal | ICD-10-CM

## 2016-12-30 MED ORDER — TAMSULOSIN HCL 0.4 MG PO CAPS: 0.4000 mg | ORAL_CAPSULE | Freq: Every day | ORAL | 3 refills | Status: DC

## 2016-12-30 NOTE — Telephone Encounter (Signed)
Patient called to let you know that Oxybutynin is making it hard to urinate as you mentioned it could and he would like to stop and try Flomax that you mentioned. Please review. Thank you-aa

## 2016-12-30 NOTE — Telephone Encounter (Signed)
Patient advised as directed below.  Thanks,  -Corah Willeford 

## 2016-12-30 NOTE — Telephone Encounter (Signed)
Stop oxybutynin and flomax sent to [facility] [address] to try

## 2017-03-28 ENCOUNTER — Other Ambulatory Visit: Payer: Self-pay | Admitting: Physician Assistant

## 2017-03-28 DIAGNOSIS — N401 Enlarged prostate with lower urinary tract symptoms: Secondary | ICD-10-CM

## 2017-03-28 DIAGNOSIS — R351 Nocturia: Principal | ICD-10-CM

## 2017-03-28 NOTE — Telephone Encounter (Signed)
[facility] on the following medications:  tamsulosin (FLOMAX) 0.4 MG CAPS capsule.  Take 1 capsule by mouth every day.  90 day supply.  [facility] [address]

## 2017-03-29 MED ORDER — TAMSULOSIN HCL 0.4 MG PO CAPS: 0.4000 mg | ORAL_CAPSULE | Freq: Every day | ORAL | 3 refills | Status: DC

## 2017-04-14 ENCOUNTER — Encounter: Payer: Self-pay | Admitting: Physician Assistant

## 2017-04-14 ENCOUNTER — Ambulatory Visit (INDEPENDENT_AMBULATORY_CARE_PROVIDER_SITE_OTHER): Payer: 59 | Admitting: Physician Assistant

## 2017-04-14 VITALS — BP 120/86 | HR 74 | Temp 98.1°F | Resp 16 | Ht 72.0 in | Wt 275.0 lb

## 2017-04-14 DIAGNOSIS — J0140 Acute pansinusitis, unspecified: Secondary | ICD-10-CM | POA: Diagnosis not present

## 2017-04-14 MED ORDER — AMOXICILLIN-POT CLAVULANATE 875-125 MG PO TABS: 1.0000 | ORAL_TABLET | Freq: Two times a day (BID) | ORAL | 0 refills | Status: DC

## 2017-04-14 NOTE — Progress Notes (Signed)
Patient: Kyle Pollard Male    DOB: Jan 18, 1961   56 y.o.   MRN: [id number] Visit Date: 04/14/2017  Today's Provider: [name], PA-C   Chief Complaint  Patient presents with  . URI   Subjective:    HPI Upper Respiratory Infection: Patient complains of symptoms of a URI. Symptoms include bilateral ear drainage , congestion and headache. Onset of symptoms was 1 week ago, gradually worsening since that time. He also c/o congestion, nasal congestion and sneezing for the past 1 day .  He is drinking plenty of fluids. Evaluation to date: none. Treatment to date: none.    Allergies  Allergen Reactions  . Penicillins Rash     Current Outpatient Prescriptions:  .  FLUoxetine (PROZAC) 20 MG capsule, Take 1 capsule (20 mg total) by mouth at bedtime., Disp: 90 capsule, Rfl: 1 .  losartan-hydrochlorothiazide (HYZAAR) 50-12.5 MG tablet, Take 1 tablet by mouth daily., Disp: 90 tablet, Rfl: 1 .  meloxicam (MOBIC) 15 MG tablet, TAKE 1 TABLET (15 MG TOTAL) BY MOUTH DAILY., Disp: 90 tablet, Rfl: 1 .  Multiple Vitamin (MULTI-VITAMINS) TABS, Take by mouth., Disp: , Rfl:  .  tamsulosin (FLOMAX) 0.4 MG CAPS capsule, Take 1 capsule (0.4 mg total) by mouth daily., Disp: 90 capsule, Rfl: 3 .  aspirin EC 81 MG tablet, Take by mouth. Reported on 11/11/2015, Disp: , Rfl:   Review of Systems  Constitutional: Negative.   HENT: Positive for congestion, ear discharge and sneezing.   Eyes: Positive for visual disturbance (things up close getting blurry; has eye exam in Nov).  Respiratory: Negative.   Cardiovascular: Negative.   Gastrointestinal: Negative.   Neurological: Positive for headaches. Negative for dizziness.  Psychiatric/Behavioral: Positive for dysphoric mood (worried about his wife). Negative for agitation, decreased concentration, self-injury, sleep disturbance and suicidal ideas. The patient is not nervous/anxious.     Social History  Substance Use Topics  . Smoking status:  Never Smoker  . Smokeless tobacco: Current User     Comment: Tobacco-dips  . Alcohol use Yes   Objective:   BP 120/86 (BP Location: Left Arm, Patient Position: Sitting, Cuff Size: Large)   Pulse 74   Temp 98.1 F (36.7 C) (Oral)   Resp 16   Ht 6' (1.829 m)   Wt 275 lb (124.7 kg)   SpO2 96%   BMI 37.30 kg/m  Vitals:   04/14/17 1001  BP: 120/86  Pulse: 74  Resp: 16  Temp: 98.1 F (36.7 C)  TempSrc: Oral  SpO2: 96%  Weight: 275 lb (124.7 kg)  Height: 6' (1.829 m)     Physical Exam  Constitutional: He appears well-developed and well-nourished. No distress.  HENT:  Head: Normocephalic and atraumatic.  Right Ear: Hearing, tympanic membrane, external ear and ear canal normal. Tympanic membrane is not erythematous and not bulging. No middle ear effusion.  Left Ear: Hearing, tympanic membrane, external ear and ear canal normal. Tympanic membrane is not erythematous and not bulging.  No middle ear effusion.  Nose: Mucosal edema and rhinorrhea present. Right sinus exhibits maxillary sinus tenderness and frontal sinus tenderness. Left sinus exhibits maxillary sinus tenderness and frontal sinus tenderness.  Mouth/Throat: Uvula is midline, oropharynx is clear and moist and mucous membranes are normal. No oropharyngeal exudate, posterior oropharyngeal edema or posterior oropharyngeal erythema.  Eyes: Pupils are equal, round, and reactive to light. Conjunctivae and EOM are normal. Right eye exhibits no discharge. Left eye exhibits no discharge.  Neck: Normal  range of motion. Neck supple. No tracheal deviation present. No Brudzinski's sign and no Kernig's sign noted. No thyromegaly present.  Cardiovascular: Normal rate, regular rhythm and normal heart sounds.  Exam reveals no gallop and no friction rub.   No murmur heard. Pulmonary/Chest: Effort normal and breath sounds normal. No stridor. No respiratory distress. He has no wheezes. He has no rales.  Lymphadenopathy:    He has no cervical  adenopathy.  Skin: Skin is warm and dry. He is not diaphoretic.  Vitals reviewed.  Depression screen PHQ 2/9 04/14/2017  Decreased Interest 1  Down, Depressed, Hopeless 0  PHQ - 2 Score 1  Altered sleeping 0  Tired, decreased energy 2  Change in appetite 0  Feeling bad or failure about yourself  0  Trouble concentrating 0  Moving slowly or fidgety/restless 0  Suicidal thoughts 0  PHQ-9 Score 3  Difficult doing work/chores Somewhat difficult      Assessment & Plan:     1. Acute pansinusitis, recurrence not specified Worsening symptoms that have not responded to OTC medications. Will give augmentin as below. Continue allergy medications. Stay well hydrated and get plenty of rest. Call if no symptom improvement or if symptoms worsen. - amoxicillin-clavulanate (AUGMENTIN) 875-125 MG tablet; Take 1 tablet by mouth 2 (two) times daily.  Dispense: 20 tablet; Refill: 0       [name], PA-C  [facility]

## 2017-04-14 NOTE — Patient Instructions (Signed)

## 2017-08-11 ENCOUNTER — Other Ambulatory Visit: Payer: Self-pay | Admitting: Physician Assistant

## 2017-08-11 DIAGNOSIS — M170 Bilateral primary osteoarthritis of knee: Secondary | ICD-10-CM

## 2017-10-26 ENCOUNTER — Encounter: Payer: Self-pay | Admitting: Family Medicine

## 2017-10-26 ENCOUNTER — Other Ambulatory Visit: Payer: Self-pay | Admitting: Family Medicine

## 2017-10-26 ENCOUNTER — Ambulatory Visit (INDEPENDENT_AMBULATORY_CARE_PROVIDER_SITE_OTHER): Payer: 59 | Admitting: Family Medicine

## 2017-10-26 VITALS — BP 138/76 | HR 55 | Temp 98.1°F | Resp 15 | Wt 266.0 lb

## 2017-10-26 DIAGNOSIS — J301 Allergic rhinitis due to pollen: Secondary | ICD-10-CM | POA: Diagnosis not present

## 2017-10-26 MED ORDER — DOXYCYCLINE HYCLATE 100 MG PO TABS: 100.0000 mg | ORAL_TABLET | Freq: Two times a day (BID) | ORAL | 0 refills | Status: DC

## 2017-10-26 NOTE — Progress Notes (Signed)
  Subjective:     Patient ID: Kyle Pollard, male   DOB: 1961/05/15, 57 y.o.   MRN: [id number] Chief Complaint  Patient presents with  . Cough    Patient comes in office today with complaints of cough and congestion for the past 6 days. Patient reports sinus pain and pressure, and right ear pain. Patient denies shortness of breath or wheezing, he has been taking otc Flonase for relief.    HPI States he feels likes his allergies are out of control. He additionally reports PND with brown sputum, malaise, and chills  Review of Systems     Objective:   Physical Exam  Constitutional: He appears well-developed and well-nourished. No distress.  Ears: T.M's intact without inflammation Sinuses: non-tender Throat: no tonsillar enlargement or exudate Neck: mild right anterior cervical tenderness Lungs: clear     Assessment:    1. Seasonal allergic rhinitis due to pollen: ? Early sinus infection     Plan:    Discussed adding Mucinex D and continuing Flonase. If not improving over the next two days to start doxycycline 100 mg bid x 10 days

## 2017-10-26 NOTE — Patient Instructions (Signed)
Start Mucinex D and continue Flonase. If sinuses not improving in next 2 days to start the antibiotic.

## 2017-12-11 ENCOUNTER — Other Ambulatory Visit: Payer: Self-pay | Admitting: Physician Assistant

## 2017-12-11 DIAGNOSIS — I10 Essential (primary) hypertension: Secondary | ICD-10-CM

## 2017-12-12 ENCOUNTER — Other Ambulatory Visit: Payer: Self-pay | Admitting: Physician Assistant

## 2017-12-12 DIAGNOSIS — F3341 Major depressive disorder, recurrent, in partial remission: Secondary | ICD-10-CM

## 2017-12-29 ENCOUNTER — Ambulatory Visit (INDEPENDENT_AMBULATORY_CARE_PROVIDER_SITE_OTHER): Payer: 59 | Admitting: Physician Assistant

## 2017-12-29 ENCOUNTER — Encounter: Payer: Self-pay | Admitting: Physician Assistant

## 2017-12-29 VITALS — BP 140/80 | HR 67 | Temp 98.6°F | Resp 16 | Ht 72.0 in | Wt 269.2 lb

## 2017-12-29 DIAGNOSIS — Z0000 Encounter for general adult medical examination without abnormal findings: Secondary | ICD-10-CM

## 2017-12-29 DIAGNOSIS — Z6836 Body mass index (BMI) 36.0-36.9, adult: Secondary | ICD-10-CM

## 2017-12-29 DIAGNOSIS — Z125 Encounter for screening for malignant neoplasm of prostate: Secondary | ICD-10-CM | POA: Diagnosis not present

## 2017-12-29 DIAGNOSIS — Z1211 Encounter for screening for malignant neoplasm of colon: Secondary | ICD-10-CM | POA: Diagnosis not present

## 2017-12-29 DIAGNOSIS — I10 Essential (primary) hypertension: Secondary | ICD-10-CM

## 2017-12-29 DIAGNOSIS — F1010 Alcohol abuse, uncomplicated: Secondary | ICD-10-CM

## 2017-12-29 DIAGNOSIS — Z114 Encounter for screening for human immunodeficiency virus [HIV]: Secondary | ICD-10-CM

## 2017-12-29 DIAGNOSIS — R350 Frequency of micturition: Secondary | ICD-10-CM

## 2017-12-29 DIAGNOSIS — F3341 Major depressive disorder, recurrent, in partial remission: Secondary | ICD-10-CM

## 2017-12-29 DIAGNOSIS — N411 Chronic prostatitis: Secondary | ICD-10-CM

## 2017-12-29 NOTE — Patient Instructions (Signed)

## 2017-12-29 NOTE — Progress Notes (Signed)
Patient: Kyle Pollard, Male    DOB: 18-Jul-1960, 57 y.o.   MRN: [id number] Visit Date: 12/29/2017  Today's Provider: [name], PA-C   Chief Complaint  Patient presents with  . Annual Exam   Subjective:    Annual physical exam [name] is a 57 y.o. male who presents today for health maintenance and complete physical. He feels well. He reports exercising. He reports he is sleeping well.  He reports increased and worsening urine frequency. Has known prostatitis. Has been using flomax with some relief. Has used medication for OAB in past and had urinary retention. He reports fatigue and headache come from not being well rested due to having to get up every 2 hours, sometimes less, for urination.  Also reports increasing alcohol intake. Previously use to drink 1-2 beers daily with supper. Reports now he may drink 4-6. Reports increased alcohol intake is only due to boredom. States he is sitting alone and just drinks. He reports he normally tries to stay busy in the evenings, but it has been too hot and his hobbies are all outdoors.   He does have some external marital stress. His wife is an alcoholic that he has tried to help multiple times, but he reports today that he is done trying because he cannot make her change.   He also reports external stress from work as well. These stressors he reports he lets out at work with agitation and frustrations. He feels he is doing the best he can at this time.  -----------------------------------------------------------------   Review of Systems  Constitutional: Positive for fatigue.  HENT: Positive for congestion and sinus pressure.   Eyes: Negative.   Respiratory: Negative.   Cardiovascular: Negative.   Gastrointestinal: Negative.   Endocrine: Positive for polyuria.  Genitourinary: Positive for frequency.  Musculoskeletal: Negative.   Skin: Negative.   Allergic/Immunologic: Negative.   Neurological: Positive for  light-headedness and headaches.  Hematological: Negative.   Psychiatric/Behavioral: Positive for agitation.    Social History      He  reports that he has never smoked. He uses smokeless tobacco. He reports that he drinks alcohol. He reports that he does not use drugs.       Social History   Socioeconomic History  . Marital status: Married    Spouse name: Not on file  . Number of children: Not on file  . Years of education: Not on file  . Highest education level: Not on file  Occupational History  . Not on file  Social Needs  . Financial resource strain: Not on file  . Food insecurity:    Worry: Not on file    Inability: Not on file  . Transportation needs:    Medical: Not on file    Non-medical: Not on file  Tobacco Use  . Smoking status: Never Smoker  . Smokeless tobacco: Current User  . Tobacco comment: Tobacco-dips  Substance and Sexual Activity  . Alcohol use: Yes  . Drug use: No  . Sexual activity: Not on file  Lifestyle  . Physical activity:    Days per week: Not on file    Minutes per session: Not on file  . Stress: Not on file  Relationships  . Social connections:    Talks on phone: Not on file    Gets together: Not on file    Attends religious service: Not on file    Active member of club or organization: Not on file  Attends meetings of clubs or organizations: Not on file    Relationship status: Not on file  Other Topics Concern  . Not on file  Social History Narrative  . Not on file    Past Medical History:  Diagnosis Date  . Allergy   . Hypertension      Patient Active Problem List   Diagnosis Date Noted  . Primary osteoarthritis involving multiple joints 11/11/2015  . Clinical depression 09/14/2015  . BP (high blood pressure) 09/14/2015  . Triggering of digit 09/14/2015  . Chronic prostatitis 09/14/2015    Past Surgical History:  Procedure Laterality Date  . Broken Leg  1992    Family History        Family Status  Relation  Name Status  . Mother  Alive  . Father  Deceased        His family history is not on file.      Allergies  Allergen Reactions  . Penicillins Rash     Current Outpatient Medications:  .  aspirin EC 81 MG tablet, Take by mouth. Reported on 11/11/2015, Disp: , Rfl:  .  FLUoxetine (PROZAC) 20 MG capsule, TAKE 1 CAPSULE (20 MG TOTAL) BY MOUTH AT BEDTIME., Disp: 90 capsule, Rfl: 2 .  losartan-hydrochlorothiazide (HYZAAR) 50-12.5 MG tablet, TAKE 1 TABLET BY MOUTH EVERY DAY, Disp: 90 tablet, Rfl: 1 .  meloxicam (MOBIC) 15 MG tablet, TAKE 1 TABLET BY MOUTH EVERY DAY, Disp: 90 tablet, Rfl: 1 .  Multiple Vitamin (MULTI-VITAMINS) TABS, Take by mouth., Disp: , Rfl:  .  tamsulosin (FLOMAX) 0.4 MG CAPS capsule, Take 1 capsule (0.4 mg total) by mouth daily., Disp: 90 capsule, Rfl: 3 .  doxycycline (VIBRA-TABS) 100 MG tablet, Take 1 tablet (100 mg total) by mouth 2 (two) times daily. (Patient not taking: Reported on 12/29/2017), Disp: 20 tablet, Rfl: 0   Patient Care Team: [name], PA-C as PCP - General (Family Medicine)      Objective:   Vitals: BP 140/80 (BP Location: Left Arm, Patient Position: Sitting, Cuff Size: Normal)   Pulse 67   Temp 98.6 F (37 C) (Oral)   Resp 16   Ht 6' (1.829 m)   Wt 269 lb 3.2 oz (122.1 kg)   SpO2 97%   BMI 36.51 kg/m      Physical Exam  Constitutional: He is oriented to person, place, and time. He appears well-developed and well-nourished.  HENT:  Head: Normocephalic and atraumatic.  Right Ear: Hearing, tympanic membrane, external ear and ear canal normal.  Left Ear: Hearing, tympanic membrane, external ear and ear canal normal.  Nose: Nose normal.  Mouth/Throat: Uvula is midline, oropharynx is clear and moist and mucous membranes are normal.  Eyes: Pupils are equal, round, and reactive to light. Conjunctivae and EOM are normal. Right eye exhibits no discharge.  Neck: Normal range of motion. Neck supple. Carotid bruit is not present. No  tracheal deviation present. No thyromegaly present.  Cardiovascular: Normal rate, regular rhythm, normal heart sounds and intact distal pulses.  No murmur heard. Pulmonary/Chest: Effort normal and breath sounds normal. No respiratory distress. He has no wheezes. He has no rales. He exhibits no tenderness.  Abdominal: Soft. Bowel sounds are normal. He exhibits no distension and no mass. There is no tenderness. There is no rebound and no guarding.  Musculoskeletal: Normal range of motion. He exhibits no edema or tenderness.  Lymphadenopathy:    He has no cervical adenopathy.  Neurological: He is alert and oriented  to person, place, and time. He has normal reflexes. He displays normal reflexes. No cranial nerve deficit. He exhibits normal muscle tone. Coordination normal.  Skin: Skin is warm and dry. No rash noted. No erythema.  Psychiatric: He has a normal mood and affect. His behavior is normal. Judgment and thought content normal.     Depression Screen PHQ 2/9 Scores 12/29/2017 04/14/2017 12/22/2016  PHQ - 2 Score 0 1 0  PHQ- 9 Score 1 3 0      Assessment & Plan:     Routine Health Maintenance and Physical Exam  Exercise Activities and Dietary recommendations Goals    None       There is no immunization history on file for this patient.  Health Maintenance  Topic Date Due  . HIV Screening  10/12/1973  . TETANUS/TDAP  10/12/1977  . COLONOSCOPY  10/12/2008  . INFLUENZA VACCINE  01/25/2018  . Hepatitis C Screening  Completed     Discussed health benefits of physical activity, and encouraged him to engage in regular exercise appropriate for his age and condition.    1. Annual physical exam Normal physical exam today. Will check labs as below and f/u pending lab results. If labs are stable and WNL he will not need to have these rechecked for one year at his next annual physical exam. He is to call the office in the meantime if he has any acute issue, questions or concerns. -  CBC with Differential/Platelet - Comprehensive metabolic panel - Hemoglobin A1c - TSH - Lipid panel  2. Colon cancer screening - Ambulatory referral to Gastroenterology  3. Prostate cancer screening Will check labs as below and f/u pending results. - PSA  4. Urine frequency Worsening. Will refer to Urology for further evaluation.  - Ambulatory referral to Urology  5. Chronic prostatitis See above medical treatment plan. - PSA - Ambulatory referral to Urology  6. Essential hypertension Stable. Continue Losartan-HCTZ 50-12.5mg . Will check labs as below and f/u pending results. - CBC with Differential/Platelet - Comprehensive metabolic panel - Hemoglobin A1c - Lipid panel  7. BMI 36.0-36.9,adult Counseled patient on healthy lifestyle modifications including dieting and exercise.  - Comprehensive metabolic panel - Hemoglobin A1c - Lipid panel  8. Alcohol abuse, daily use Discussed decreasing alcohol consumption and trying to find healthier alternatives. Also discussed him joining [employer] for family members to help and give him a support group with dealing with his wife's issues at home - Comprehensive metabolic panel  9. Screening for HIV without presence of risk factors - HIV antibody (with reflex)  10. Recurrent major depressive disorder, in partial remission (HCC) Continue Fluoxetine 20mg  daily.   --------------------------------------------------------------------    [name], PA-C  [facility]

## 2017-12-30 LAB — LIPID PANEL
CHOL/HDL RATIO: 3.8 ratio (ref 0.0–5.0)
Cholesterol, Total: 184 mg/dL (ref 100–199)
HDL: 48 mg/dL (ref >39)
LDL CALC: 104 mg/dL — AB (ref 0–99)
Triglycerides: 160 mg/dL — ABNORMAL HIGH (ref 0–149)
VLDL Cholesterol Cal: 32 mg/dL (ref 5–40)

## 2017-12-30 LAB — CBC WITH DIFFERENTIAL/PLATELET
BASOS ABS: 0 10*3/uL (ref 0.0–0.2)
BASOS: 1 %
EOS (ABSOLUTE): 0.3 10*3/uL (ref 0.0–0.4)
Eos: 6 %
HEMATOCRIT: 46.3 % (ref 37.5–51.0)
Hemoglobin: 15.6 g/dL (ref 13.0–17.7)
Immature Grans (Abs): 0 10*3/uL (ref 0.0–0.1)
Immature Granulocytes: 0 %
Lymphocytes Absolute: 2.1 10*3/uL (ref 0.7–3.1)
Lymphs: 41 %
MCH: 30.1 pg (ref 26.6–33.0)
MCHC: 33.7 g/dL (ref 31.5–35.7)
MCV: 89 fL (ref 79–97)
MONOS ABS: 0.6 10*3/uL (ref 0.1–0.9)
Monocytes: 12 %
NEUTROS ABS: 2.1 10*3/uL (ref 1.4–7.0)
Neutrophils: 40 %
Platelets: 240 10*3/uL (ref 150–450)
RBC: 5.19 x10E6/uL (ref 4.14–5.80)
RDW: 14.4 % (ref 12.3–15.4)
WBC: 5.2 10*3/uL (ref 3.4–10.8)

## 2017-12-30 LAB — HEMOGLOBIN A1C
ESTIMATED AVERAGE GLUCOSE: 120 mg/dL
HEMOGLOBIN A1C: 5.8 % — AB (ref 4.8–5.6)

## 2017-12-30 LAB — COMPREHENSIVE METABOLIC PANEL
ALK PHOS: 57 IU/L (ref 39–117)
ALT: 21 IU/L (ref 0–44)
AST: 20 IU/L (ref 0–40)
Albumin/Globulin Ratio: 1.5 (ref 1.2–2.2)
Albumin: 4.3 g/dL (ref 3.5–5.5)
BILIRUBIN TOTAL: 0.5 mg/dL (ref 0.0–1.2)
BUN / CREAT RATIO: 22 — AB (ref 9–20)
BUN: 18 mg/dL (ref 6–24)
CHLORIDE: 100 mmol/L (ref 96–106)
CO2: 23 mmol/L (ref 20–29)
Calcium: 9.5 mg/dL (ref 8.7–10.2)
Creatinine, Ser: 0.82 mg/dL (ref 0.76–1.27)
GFR calc Af Amer: 112 mL/min/{1.73_m2} (ref >59)
GFR calc non Af Amer: 97 mL/min/{1.73_m2} (ref >59)
GLOBULIN, TOTAL: 2.8 g/dL (ref 1.5–4.5)
GLUCOSE: 98 mg/dL (ref 65–99)
POTASSIUM: 4.3 mmol/L (ref 3.5–5.2)
SODIUM: 137 mmol/L (ref 134–144)
Total Protein: 7.1 g/dL (ref 6.0–8.5)

## 2017-12-30 LAB — HIV ANTIBODY (ROUTINE TESTING W REFLEX): HIV SCREEN 4TH GENERATION: NONREACTIVE

## 2017-12-30 LAB — TSH: TSH: 2.18 u[IU]/mL (ref 0.450–4.500)

## 2017-12-30 LAB — PSA: PROSTATE SPECIFIC AG, SERUM: 1.8 ng/mL (ref 0.0–4.0)

## 2018-01-01 ENCOUNTER — Telehealth: Payer: Self-pay

## 2018-01-01 NOTE — Telephone Encounter (Signed)
Patient advised as directed below.  Thanks,  -Dajae Kizer 

## 2018-01-01 NOTE — Telephone Encounter (Signed)
-----   Message from [name], PA-C sent at 01/01/2018  1:56 PM EDT ----- All labs are within normal limits and stable.  Thanks! -JB

## 2018-01-08 ENCOUNTER — Encounter: Payer: Self-pay | Admitting: Physician Assistant

## 2018-01-08 ENCOUNTER — Ambulatory Visit (INDEPENDENT_AMBULATORY_CARE_PROVIDER_SITE_OTHER): Payer: 59 | Admitting: Physician Assistant

## 2018-01-08 VITALS — BP 150/80 | HR 78 | Temp 98.5°F | Resp 16 | Wt 270.0 lb

## 2018-01-08 DIAGNOSIS — M1711 Unilateral primary osteoarthritis, right knee: Secondary | ICD-10-CM

## 2018-01-08 MED ORDER — METHYLPREDNISOLONE ACETATE 80 MG/ML IJ SUSP
80.0000 mg | Freq: Once | INTRAMUSCULAR | Status: AC
  Administered 2018-01-08: 80 mg via INTRA_ARTICULAR

## 2018-01-08 NOTE — Progress Notes (Signed)
Patient: Kyle Pollard Male    DOB: 1961-02-14   57 y.o.   MRN: [id number] Visit Date: 01/08/2018  Today's Provider: [name], PA-C   Chief Complaint  Patient presents with  . Knee Pain   Subjective:    HPI Patient here because of his knee pain. Reports the pain is worse. He was not able to work on Friday and today. 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 He has been using meloxicam and ice without relief. He is asking for steroid injection on his knee.     Allergies  Allergen Reactions  . Penicillins Rash     Current Outpatient Medications:  .  aspirin EC 81 MG tablet, Take by mouth. Reported on 11/11/2015, Disp: , Rfl:  .  FLUoxetine (PROZAC) 20 MG capsule, TAKE 1 CAPSULE (20 MG TOTAL) BY MOUTH AT BEDTIME., Disp: 90 capsule, Rfl: 2 .  losartan-hydrochlorothiazide (HYZAAR) 50-12.5 MG tablet, TAKE 1 TABLET BY MOUTH EVERY DAY, Disp: 90 tablet, Rfl: 1 .  meloxicam (MOBIC) 15 MG tablet, TAKE 1 TABLET BY MOUTH EVERY DAY, Disp: 90 tablet, Rfl: 1 .  Multiple Vitamin (MULTI-VITAMINS) TABS, Take by mouth., Disp: , Rfl:  .  tamsulosin (FLOMAX) 0.4 MG CAPS capsule, Take 1 capsule (0.4 mg total) by mouth daily., Disp: 90 capsule, Rfl: 3  Review of Systems  Constitutional: Negative.   Respiratory: Negative.   Cardiovascular: Negative.   Musculoskeletal: Positive for arthralgias, gait problem and joint swelling.  Neurological: Negative for weakness and numbness.    Social History   Tobacco Use  . Smoking status: Never Smoker  . Smokeless tobacco: Current User  . Tobacco comment: Tobacco-dips  Substance Use Topics  . Alcohol use: Yes   Objective:   BP (!) 150/80 (BP Location: Left Arm, Patient Position: Sitting, Cuff Size: Normal)   Pulse 78   Temp 98.5 F (36.9 C) (Oral)   Resp 16   Wt 270 lb (122.5 kg)   BMI 36.62 kg/m  Vitals:   01/08/18 1508  BP: (!) 150/80  Pulse: 78  Resp: 16  Temp:  98.5 F (36.9 C)  TempSrc: Oral  Weight: 270 lb (122.5 kg)     Physical Exam  Constitutional: He appears well-developed and well-nourished. No distress.  HENT:  Head: Normocephalic and atraumatic.  Neck: Normal range of motion. Neck supple.  Cardiovascular: Normal rate, regular rhythm and normal heart sounds. Exam reveals no gallop and no friction rub.  No murmur heard. Pulmonary/Chest: Effort normal and breath sounds normal. No respiratory distress. He has no wheezes. He has no rales.  Musculoskeletal:       Right knee: He exhibits normal range of motion, no swelling, no ecchymosis, normal patellar mobility, normal meniscus and no MCL laxity. Tenderness found. Medial joint line tenderness noted.  Skin: He is not diaphoretic.  Vitals reviewed.       Assessment & Plan:     1. Primary osteoarthritis of right knee Cortisone injection given as below. See procedure note. After care instructions printed. Call if no improvements. - methylPREDNISolone acetate (DEPO-MEDROL) injection 80 mg  Procedure Note:  Benefits, risks (including infection, tattooing, adipose dimpling, and tendon rupture) and alternatives were explained to the patient. All questions were sought and answered.  Patient agreed to continue and verbal consent was obtained.   An aspiration and steroid injection was performed on right knee using 4cc of 1% plain Lidocaine  and 80 mg of depo-medrol. There was minimal bleeding. Hemostasis was intact. A dry dressing was applied. The procedure was well tolerated.       [name], PA-C  [facility]

## 2018-01-08 NOTE — Patient Instructions (Signed)
Knee Injection, Care After  Refer to this sheet in the next few weeks. These instructions provide you with information about caring for yourself after your procedure. Your health care provider may also give you more specific instructions. Your treatment has been planned according to current medical practices, but problems sometimes occur. Call your health care provider if you have any problems or questions after your procedure.  What can I expect after the procedure?  After the procedure, it is common to have:   Soreness.   Warmth.   Swelling.    You may have more pain, swelling, and warmth than you did before the injection. This reaction may last for about one day.  Follow these instructions at home:  Bathing   If you were given a bandage (dressing), keep it dry until your health care provider says it can be removed. Ask your health care provider when you can start showering or taking a bath.  Managing pain, stiffness, and swelling   If directed, apply ice to the injection area:  ? Put ice in a plastic bag.  ? Place a towel between your skin and the bag.  ? Leave the ice on for 20 minutes, 2-3 times per day.   Do not apply heat to your knee.   Raise the injection area above the level of your heart while you are sitting or lying down.  Activity   Avoid strenuous activities for as long as directed by your health care provider. Ask your health care provider when you can return to your normal activities.  General instructions   Take medicines only as directed by your health care provider.   Do not take aspirin or other over-the-counter medicines unless your health care provider says you can.   Check your injection site every day for signs of infection. Watch for:  ? Redness, swelling, or pain.  ? Fluid, blood, or pus.   Follow your health care provider's instructions about dressing changes and removal.  Contact a health care provider if:   You have symptoms at your injection site that last longer than  two days after your procedure.   You have redness, swelling, or pain in your injection area.   You have fluid, blood, or pus coming from your injection site.   You have warmth in your injection area.   You have a fever.   Your pain is not controlled with medicine.  Get help right away if:   Your knee turns very red.   Your knee becomes very swollen.   Your knee pain is severe.  This information is not intended to replace advice given to you by your health care provider. Make sure you discuss any questions you have with your health care provider.  Document Released: 07/04/2014 Document Revised: 02/17/2016 Document Reviewed: 04/23/2014  Elsevier Interactive Patient Education  2018 [employer]

## 2018-01-22 ENCOUNTER — Ambulatory Visit: Payer: 59 | Admitting: Urology

## 2018-01-29 ENCOUNTER — Telehealth: Payer: Self-pay | Admitting: Gastroenterology

## 2018-01-29 ENCOUNTER — Other Ambulatory Visit: Payer: Self-pay

## 2018-01-29 DIAGNOSIS — Z1211 Encounter for screening for malignant neoplasm of colon: Secondary | ICD-10-CM

## 2018-01-29 NOTE — Progress Notes (Signed)
01/30/2018 4:47 PM   Kyle Pollard July 21, 1960 [id number]  Referring provider: [name], PA-C [address] [place], [place] [address]  Chief Complaint  Patient presents with  . Urinary Frequency    HPI: Patient is a 57 year old Caucasian male with a history of BPH and prostatitis who is referred by [name], PA-C.   He is experiencing frequency, urgency, nocturia, incontinence, intermittency, hesitancy, straining to urinate and a weak urinary stream.   This has been going on for 20 years.  Patient denies any gross hematuria, dysuria or suprapubic/flank pain.  Patient denies any fevers, chills, nausea or vomiting.   He was on tamsulosin in his 56's.  He did not like the side effects, so he discontinued the medications.  He was restarted on  tamsulosin very recently.    He was tried on an OAB medication and went into retention.    He has had UTI's.  No Foley's.    His PVR is 24 mL.    Urological history: Saw urologist in [place], but "he didn't do anything"   He drinks mainly water.  He cut off caffeine yesterday.  He drinks beers 3 to 4 nightly.  He does not have constipation or diarrhea.     PMH: Past Medical History:  Diagnosis Date  . Allergy   . Hypertension     Surgical History: Past Surgical History:  Procedure Laterality Date  . Broken Leg  1992    Home Medications:  Allergies as of 01/30/2018      Reactions   Penicillins Rash      Medication List        Accurate as of 01/30/18  4:47 PM. Always use your most recent med list.          aspirin EC 81 MG tablet Take by mouth. Reported on 11/11/2015   FLUoxetine 20 MG capsule Commonly known as:  PROZAC TAKE 1 CAPSULE (20 MG TOTAL) BY MOUTH AT BEDTIME.   losartan-hydrochlorothiazide 50-12.5 MG tablet Commonly known as:  HYZAAR TAKE 1 TABLET BY MOUTH EVERY DAY   meloxicam 15 MG tablet Commonly known as:  MOBIC TAKE 1 TABLET BY MOUTH EVERY DAY   MULTI-VITAMINS  Tabs Take by mouth.   tamsulosin 0.4 MG Caps capsule Commonly known as:  FLOMAX Take 1 capsule (0.4 mg total) by mouth daily.       Allergies:  Allergies  Allergen Reactions  . Penicillins Rash    Family History: Family History  Problem Relation Age of Onset  . Prostate cancer Neg Hx   . Bladder Cancer Neg Hx   . Kidney cancer Neg Hx     Social History:  reports that he has never smoked. He uses smokeless tobacco. He reports that he drinks alcohol. He reports that he does not use drugs.  ROS: UROLOGY Frequent Urination?: Yes Hard to postpone urination?: Yes Burning/pain with urination?: No Get up at night to urinate?: Yes Leakage of urine?: Yes Urine stream starts and stops?: Yes Trouble starting stream?: Yes Do you have to strain to urinate?: Yes Blood in urine?: No Urinary tract infection?: No Sexually transmitted disease?: No Injury to kidneys or bladder?: No Painful intercourse?: No Weak stream?: Yes Erection problems?: No Penile pain?: No  Gastrointestinal Nausea?: No Vomiting?: No Indigestion/heartburn?: Yes Diarrhea?: No Constipation?: No  Constitutional Fever: No Night sweats?: Yes Weight loss?: No Fatigue?: No  Skin Skin rash/lesions?: No Itching?: No  Eyes Blurred vision?: No Double vision?:  No  Ears/Nose/Throat Sore throat?: No Sinus problems?: Yes  Hematologic/Lymphatic Swollen glands?: No Easy bruising?: No  Cardiovascular Leg swelling?: No Chest pain?: No  Respiratory Cough?: No Shortness of breath?: No  Endocrine Excessive thirst?: No  Musculoskeletal Back pain?: No Joint pain?: Yes  Neurological Headaches?: Yes Dizziness?: No  Psychologic Depression?: No Anxiety?: No  Physical Exam: BP 136/85 (BP Location: Left Arm, Patient Position: Sitting, Cuff Size: Normal)   Pulse 69   Ht 6' (1.829 m)   Wt 270 lb 8 oz (122.7 kg)   BMI 36.69 kg/m   Constitutional:  Well nourished. Alert and oriented, No acute  distress. HEENT: [place] AT, moist mucus membranes.  Trachea midline, no masses. Cardiovascular: No clubbing, cyanosis, or edema. Respiratory: Normal respiratory effort, no increased work of breathing. GI: Abdomen is soft, non tender, non distended, no abdominal masses. Liver and spleen not palpable.  No hernias appreciated.  Stool sample for occult testing is not indicated.   GU: No CVA tenderness.  No bladder fullness or masses.  Patient with circumcised phallus.  Urethral meatus is patent.  No penile discharge. No penile lesions or rashes. Scrotum without lesions, cysts, rashes and/or edema.  Testicles are located scrotally bilaterally. No masses are appreciated in the testicles. Left and right epididymis are normal. Rectal: Patient with  normal sphincter tone. Anus and perineum without scarring or rashes. No rectal masses are appreciated. Prostate is approximately 25 grams, no nodules are appreciated. Seminal vesicles are normal. Skin: No rashes, bruises or suspicious lesions. Lymph: No cervical or inguinal adenopathy. Neurologic: Grossly intact, no focal deficits, moving all 4 extremities. Psychiatric: Normal mood and affect.  Laboratory Data: PSA Trend  0.8 in 08/2015  1.4 in 11/2016  1.8 in 12/2017 Lab Results  Component Value Date   WBC 5.2 12/29/2017   HGB 15.6 12/29/2017   HCT 46.3 12/29/2017   MCV 89 12/29/2017   PLT 240 12/29/2017    Lab Results  Component Value Date   CREATININE 0.82 12/29/2017    No results found for: PSA  No results found for: TESTOSTERONE  Lab Results  Component Value Date   HGBA1C 5.8 (H) 12/29/2017    Lab Results  Component Value Date   TSH 2.180 12/29/2017       Component Value Date/Time   CHOL 184 12/29/2017 1027   HDL 48 12/29/2017 1027   CHOLHDL 3.8 12/29/2017 1027   LDLCALC 104 (H) 12/29/2017 1027    Lab Results  Component Value Date   AST 20 12/29/2017   Lab Results  Component Value Date   ALT 21 12/29/2017   No  components found for: ALKALINEPHOPHATASE No components found for: BILIRUBINTOTAL  No results found for: ESTRADIOL  Urinalysis Negative.  See Epic.  I have reviewed the labs.   Pertinent Imaging: Results for Kyle Pollard, Kyle Pollard (MRN [id number]) as of 01/30/2018 16:42  Ref. Range 01/30/2018 15:57  Scan Result Unknown 24     Assessment & Plan:    1. Urgency Discussed behavioral therapies, bladder training and bladder control strategies OAB medication caused retention Continue the tamsulosin for now UA was negative Will need cystoscopy to evaluate the internal anatomy of the urethra, prostate and bladder  If cystoscopy is negative, may need to consider UDS and/or PT Also may need to limit beer consumption in the future   2. BPH with LU TS Cystoscopy pending    Return for cystoscopy for refractory urgency/frequency .  These notes generated with voice recognition software. I apologize for  typographical errors.  Zara Council, PA-C  [facility] [address]  [place], [place] [address] [phone]

## 2018-01-29 NOTE — Telephone Encounter (Signed)
Gastroenterology Pre-Procedure Review  Request Date: 03/19/18   [facility] Requesting Physician: Dr. Bonna Gains  PATIENT REVIEW QUESTIONS: The patient responded to the following health history questions as indicated:    1. Are you having any GI issues? no 2. Do you have a personal history of Polyps? no 3. Do you have a family history of Colon Cancer or Polyps? no 4. Diabetes Mellitus? no 5. Joint replacements in the past 12 months?no 6. Major health problems in the past 3 months?no 7. Any artificial heart valves, MVP, or defibrillator?no     MEDICATIONS & ALLERGIES:    Patient reports the following regarding taking any anticoagulation/antiplatelet therapy:   Plavix, Coumadin, Eliquis, Xarelto, Lovenox, Pradaxa, Brilinta, or Effient? no Aspirin? no  Patient confirms/reports the following medications:  Current Outpatient Medications  Medication Sig Dispense Refill  . aspirin EC 81 MG tablet Take by mouth. Reported on 11/11/2015    . FLUoxetine (PROZAC) 20 MG capsule TAKE 1 CAPSULE (20 MG TOTAL) BY MOUTH AT BEDTIME. 90 capsule 2  . losartan-hydrochlorothiazide (HYZAAR) 50-12.5 MG tablet TAKE 1 TABLET BY MOUTH EVERY DAY 90 tablet 1  . meloxicam (MOBIC) 15 MG tablet TAKE 1 TABLET BY MOUTH EVERY DAY 90 tablet 1  . Multiple Vitamin (MULTI-VITAMINS) TABS Take by mouth.    . tamsulosin (FLOMAX) 0.4 MG CAPS capsule Take 1 capsule (0.4 mg total) by mouth daily. 90 capsule 3   No current facility-administered medications for this visit.     Patient confirms/reports the following allergies:  Allergies  Allergen Reactions  . Penicillins Rash    No orders of the defined types were placed in this encounter.   AUTHORIZATION INFORMATION Primary Insurance: 1D#: Group #:  Secondary Insurance: 1D#: Group #:  SCHEDULE INFORMATION: Date: 03/19/18    [place] Time: Location:  [place]

## 2018-01-30 ENCOUNTER — Ambulatory Visit (INDEPENDENT_AMBULATORY_CARE_PROVIDER_SITE_OTHER): Payer: 59 | Admitting: Urology

## 2018-01-30 ENCOUNTER — Encounter: Payer: Self-pay | Admitting: Urology

## 2018-01-30 VITALS — BP 136/85 | HR 69 | Ht 72.0 in | Wt 270.5 lb

## 2018-01-30 DIAGNOSIS — N401 Enlarged prostate with lower urinary tract symptoms: Secondary | ICD-10-CM

## 2018-01-30 DIAGNOSIS — R3915 Urgency of urination: Secondary | ICD-10-CM | POA: Diagnosis not present

## 2018-01-30 DIAGNOSIS — N138 Other obstructive and reflux uropathy: Secondary | ICD-10-CM

## 2018-01-30 LAB — BLADDER SCAN AMB NON-IMAGING: Scan Result: 24

## 2018-01-30 NOTE — Patient Instructions (Signed)
Cystoscopy  Cystoscopy is a procedure that is used to help diagnose and sometimes treat conditions that affect that lower urinary tract. The lower urinary tract includes the bladder and the tube that drains urine from the bladder out of the body (urethra). Cystoscopy is performed with a thin, tube-shaped instrument with a light and camera at the end (cystoscope). The cystoscope may be hard (rigid) or flexible, depending on the goal of the procedure.The cystoscope is inserted through the urethra, into the bladder.  Cystoscopy may be recommended if you have:   Urinary tractinfections that keep coming back (recurring).   Blood in the urine (hematuria).   Loss of bladder control (urinary incontinence) or an overactive bladder.   Unusual cells found in a urine sample.   A blockage in the urethra.   Painful urination.   An abnormality in the bladder found during an intravenous pyelogram (IVP) or CT scan.    Cystoscopy may also be done to remove a sample of tissue to be examined under a microscope (biopsy).  Tell a health care provider about:   Any allergies you have.   All medicines you are taking, including vitamins, herbs, eye drops, creams, and over-the-counter medicines.   Any problems you or family members have had with anesthetic medicines.   Any blood disorders you have.   Any surgeries you have had.   Any medical conditions you have.   Whether you are pregnant or may be pregnant.  What are the risks?  Generally, this is a safe procedure. However, problems may occur, including:   Infection.   Bleeding.   Allergic reactions to medicines.   Damage to other structures or organs.    What happens before the procedure?   Ask your health care provider about:  ? Changing or stopping your regular medicines. This is especially important if you are taking diabetes medicines or blood thinners.  ? Taking medicines such as aspirin and ibuprofen. These medicines can thin your blood. Do not take these medicines  before your procedure if your health care provider instructs you not to.   Follow instructions from your health care provider about eating or drinking restrictions.   You may be given antibiotic medicine to help prevent infection.   You may have an exam or testing, such as X-rays of the bladder, urethra, or kidneys.   You may have urine tests to check for signs of infection.   Plan to have someone take you home after the procedure.  What happens during the procedure?   To reduce your risk of infection,your health care team will wash or sanitize their hands.   You will be given one or more of the following:  ? A medicine to help you relax (sedative).  ? A medicine to numb the area (local anesthetic).   The area around the opening of your urethra will be cleaned.   The cystoscope will be passed through your urethra into your bladder.   Germ-free (sterile)fluid will flow through the cystoscope to fill your bladder. The fluid will stretch your bladder so that your surgeon can clearly examine your bladder walls.   The cystoscope will be removed and your bladder will be emptied.  The procedure may vary among health care providers and hospitals.  What happens after the procedure?   You may have some soreness or pain in your abdomen and urethra. Medicines will be available to help you.   You may have some blood in your urine.   Do not   drive for 24 hours if you received a sedative.  This information is not intended to replace advice given to you by your health care provider. Make sure you discuss any questions you have with your health care provider.  Document Released: 06/10/2000 Document Revised: 10/22/2015 Document Reviewed: 04/30/2015  Elsevier Interactive Patient Education  2018 Elsevier Inc.

## 2018-02-04 ENCOUNTER — Telehealth: Payer: Self-pay | Admitting: Urology

## 2018-02-04 NOTE — Telephone Encounter (Signed)
Would you ask Kyle Pollard to provide an UA.  We didn't collect one from him at his visit due to the lab being closed.

## 2018-02-09 ENCOUNTER — Other Ambulatory Visit: Payer: Self-pay | Admitting: Family Medicine

## 2018-02-09 DIAGNOSIS — R350 Frequency of micturition: Secondary | ICD-10-CM

## 2018-02-09 NOTE — Telephone Encounter (Signed)
Patient notified and Lab appointment scheduled.

## 2018-02-12 ENCOUNTER — Other Ambulatory Visit: Payer: 59

## 2018-02-12 DIAGNOSIS — R350 Frequency of micturition: Secondary | ICD-10-CM

## 2018-02-12 LAB — URINALYSIS, COMPLETE
Bilirubin, UA: NEGATIVE
Glucose, UA: NEGATIVE
LEUKOCYTES UA: NEGATIVE
Nitrite, UA: NEGATIVE
RBC, UA: NEGATIVE
SPEC GRAV UA: 1.02 (ref 1.005–1.030)
Urobilinogen, Ur: 1 mg/dL (ref 0.2–1.0)
pH, UA: 7 (ref 5.0–7.5)

## 2018-02-12 LAB — MICROSCOPIC EXAMINATION
Epithelial Cells (non renal): NONE SEEN /hpf (ref 0–10)
RBC, UA: NONE SEEN /hpf (ref 0–2)
WBC, UA: NONE SEEN /hpf (ref 0–5)

## 2018-02-17 ENCOUNTER — Other Ambulatory Visit: Payer: Self-pay | Admitting: Physician Assistant

## 2018-02-17 DIAGNOSIS — M170 Bilateral primary osteoarthritis of knee: Secondary | ICD-10-CM

## 2018-03-08 ENCOUNTER — Encounter: Payer: Self-pay | Admitting: Urology

## 2018-03-08 ENCOUNTER — Ambulatory Visit (INDEPENDENT_AMBULATORY_CARE_PROVIDER_SITE_OTHER): Payer: 59 | Admitting: Urology

## 2018-03-08 VITALS — BP 139/82 | HR 79 | Ht 72.0 in | Wt 271.1 lb

## 2018-03-08 DIAGNOSIS — R3915 Urgency of urination: Secondary | ICD-10-CM

## 2018-03-08 DIAGNOSIS — N401 Enlarged prostate with lower urinary tract symptoms: Secondary | ICD-10-CM

## 2018-03-08 DIAGNOSIS — N138 Other obstructive and reflux uropathy: Secondary | ICD-10-CM

## 2018-03-08 DIAGNOSIS — R350 Frequency of micturition: Secondary | ICD-10-CM

## 2018-03-08 LAB — URINALYSIS, COMPLETE
Bilirubin, UA: NEGATIVE
Glucose, UA: NEGATIVE
Ketones, UA: NEGATIVE
LEUKOCYTES UA: NEGATIVE
Nitrite, UA: NEGATIVE
PROTEIN UA: NEGATIVE
RBC, UA: NEGATIVE
Specific Gravity, UA: 1.025 (ref 1.005–1.030)
Urobilinogen, Ur: 0.2 mg/dL (ref 0.2–1.0)
pH, UA: 7 (ref 5.0–7.5)

## 2018-03-08 NOTE — Progress Notes (Signed)
   03/08/18  CC:  Chief Complaint  Patient presents with  . Cysto   Indications: Lower urinary tract symptoms  HPI: Refer to [employer] note dated 01/30/2018.  Blood pressure 139/82, pulse 79, height 6' (1.829 m), weight 271 lb 1.6 oz (123 kg). NED. A&Ox3.   No respiratory distress   Abd soft, NT, ND Normal phallus with bilateral descended testicles  Cystoscopy Procedure Note  Patient identification was confirmed, informed consent was obtained, and patient was prepped using Betadine solution.  Lidocaine jelly was administered per urethral meatus.    Preoperative abx where received prior to procedure.     Pre-Procedure: - Inspection reveals a normal caliber ureteral meatus.  Procedure: The flexible cystoscope was introduced without difficulty - No urethral strictures/lesions are present. - Moderate lateral lobe enlargement prostate  - Normal bladder neck - Bilateral ureteral orifices identified - Bladder mucosa  reveals no ulcers, tumors, or lesions - No bladder stones - No trabeculation  Retroflexion shows no intravesical median lobe   Post-Procedure: - Patient tolerated the procedure well  Assessment/ Plan: 57 year old male with an approximately 30-year history of bothersome lower urinary tract symptoms.  His most bothersome symptom at this point is nocturia x3-4.  He has failed multiple medications including tamsulosin, anticholinergic.  He took Myrbetriq 25 mg for 2 weeks and did not see improvement.  He was given samples of Myrbetriq 50 mg to try.  I discussed obtaining a urodynamic study to attempt to assess whether his storage related symptoms are secondary to outlet obstruction versus de novo overactivity.  He would like to think this over before scheduling.  We will have him follow-up with Larene Beach in 1 month.

## 2018-03-10 ENCOUNTER — Encounter: Payer: Self-pay | Admitting: Urology

## 2018-03-14 ENCOUNTER — Encounter: Payer: Self-pay | Admitting: Physician Assistant

## 2018-03-14 ENCOUNTER — Telehealth: Payer: Self-pay | Admitting: Gastroenterology

## 2018-03-14 ENCOUNTER — Ambulatory Visit (INDEPENDENT_AMBULATORY_CARE_PROVIDER_SITE_OTHER): Payer: 59 | Admitting: Physician Assistant

## 2018-03-14 VITALS — BP 118/70 | HR 63 | Temp 97.9°F | Resp 16 | Wt 269.0 lb

## 2018-03-14 DIAGNOSIS — H6502 Acute serous otitis media, left ear: Secondary | ICD-10-CM | POA: Diagnosis not present

## 2018-03-14 DIAGNOSIS — H6122 Impacted cerumen, left ear: Secondary | ICD-10-CM | POA: Diagnosis not present

## 2018-03-14 MED ORDER — CEFDINIR 300 MG PO CAPS: 300.0000 mg | ORAL_CAPSULE | Freq: Two times a day (BID) | ORAL | 0 refills | Status: DC

## 2018-03-14 NOTE — Patient Instructions (Signed)

## 2018-03-14 NOTE — Progress Notes (Signed)
Patient: Kyle Pollard Male    DOB: 04-Nov-1960   57 y.o.   MRN: [id number] Visit Date: 03/14/2018  Today's Provider: [name], PA-C   Chief Complaint  Patient presents with  . Ear Drainage   Subjective:    Ear Drainage   There is pain in the left ear. This is a new problem. The current episode started in the past 7 days (started on Monday). The problem occurs constantly. The problem has been gradually worsening. There has been no fever. The patient is experiencing no pain. Pertinent negatives include no ear discharge, headaches or rhinorrhea. Associated symptoms comments: Reports that he feels pain in the left side. Reports his ear feels "plugged".. Treatments tried: IBU and vicks spray, Alka Seltzer Cold. The treatment provided no relief.      Allergies  Allergen Reactions  . Penicillins Rash     Current Outpatient Medications:  .  FLUoxetine (PROZAC) 20 MG capsule, TAKE 1 CAPSULE (20 MG TOTAL) BY MOUTH AT BEDTIME., Disp: 90 capsule, Rfl: 2 .  losartan-hydrochlorothiazide (HYZAAR) 50-12.5 MG tablet, TAKE 1 TABLET BY MOUTH EVERY DAY, Disp: 90 tablet, Rfl: 1 .  meloxicam (MOBIC) 15 MG tablet, TAKE 1 TABLET BY MOUTH EVERY DAY, Disp: 90 tablet, Rfl: 1 .  Multiple Vitamin (MULTI-VITAMINS) TABS, Take by mouth., Disp: , Rfl:  .  tamsulosin (FLOMAX) 0.4 MG CAPS capsule, Take 1 capsule (0.4 mg total) by mouth daily., Disp: 90 capsule, Rfl: 3 .  aspirin EC 81 MG tablet, Take by mouth. Reported on 11/11/2015, Disp: , Rfl:   Review of Systems  Constitutional: Negative.   HENT: Positive for postnasal drip, sinus pressure, sinus pain and sneezing. Negative for ear discharge and rhinorrhea.   Eyes: Positive for itching and visual disturbance ("blurry Vision").  Respiratory: Negative.   Cardiovascular: Negative.   Neurological: Positive for weakness. Negative for dizziness and headaches.    Social History   Tobacco Use  . Smoking status: Never Smoker  . Smokeless  tobacco: Current User  . Tobacco comment: Tobacco-dips  Substance Use Topics  . Alcohol use: Yes   Objective:   BP 118/70 (BP Location: Left Arm, Patient Position: Sitting, Cuff Size: Normal)   Pulse 63   Temp 97.9 F (36.6 C) (Oral)   Resp 16   Wt 269 lb (122 kg)   SpO2 97%   BMI 36.48 kg/m  Vitals:   03/14/18 1340  BP: 118/70  Pulse: 63  Resp: 16  Temp: 97.9 F (36.6 C)  TempSrc: Oral  SpO2: 97%  Weight: 269 lb (122 kg)     Physical Exam  Constitutional: He appears well-developed and well-nourished. No distress.  HENT:  Head: Normocephalic and atraumatic.  Right Ear: Hearing, tympanic membrane, external ear and ear canal normal.  Left Ear: Hearing, external ear and ear canal normal. There is tenderness. Tympanic membrane is not perforated, not erythematous and not bulging. A middle ear effusion is present.  Nose: Nose normal.  Mouth/Throat: Oropharynx is clear and moist. No oropharyngeal exudate.  Cerumen noted in the 6 o'clock position obstructing half of the TM visually. Ear lavage performed and successful. Noted to have normal appearing TM with clear fluid and air bubbles present in the middle ear  Eyes: Pupils are equal, round, and reactive to light. Conjunctivae and EOM are normal. Right eye exhibits no discharge. Left eye exhibits no discharge.  Neck: Normal range of motion. Neck supple. No JVD present. No tracheal deviation present. No Brudzinski's  sign and no Kernig's sign noted. No thyromegaly present.  Cardiovascular: Normal rate, regular rhythm and normal heart sounds. Exam reveals no gallop and no friction rub.  No murmur heard. Pulmonary/Chest: Effort normal and breath sounds normal. No stridor. No respiratory distress. He has no wheezes. He has no rales. He exhibits no tenderness.  Lymphadenopathy:    He has no cervical adenopathy.  Skin: Skin is warm and dry.  Vitals reviewed.      Assessment & Plan:     1. Cerumen debris on tympanic membrane of  left ear Lavage successful.  - Ear Lavage  2. Non-recurrent acute serous otitis media of left ear Worsening symptoms that have not responded to OTC medications. Will give Omnicef as below. Continue allergy medications. Stay well hydrated and get plenty of rest. Call if no symptom improvement or if symptoms worsen. - cefdinir (OMNICEF) 300 MG capsule; Take 1 capsule (300 mg total) by mouth 2 (two) times daily.  Dispense: 10 capsule; Refill: 0       [name], PA-C  [facility]

## 2018-03-14 NOTE — Telephone Encounter (Signed)
PT left vm to cancel his procedure 9/23

## 2018-03-15 ENCOUNTER — Telehealth: Payer: Self-pay

## 2018-03-15 MED ORDER — DOXYCYCLINE HYCLATE 100 MG PO TABS: 100.0000 mg | ORAL_TABLET | Freq: Two times a day (BID) | ORAL | 0 refills | Status: DC

## 2018-03-15 NOTE — Telephone Encounter (Signed)
Patient contacted office to cancel colonoscopy.  He will call back closer to the end of the year to reschedule.  Procedure has been canceled with Endoscopy  Left message with [facility] in [place].  Thanks [employer]

## 2018-03-15 NOTE — Addendum Note (Signed)
Addended by: [name] on: 03/15/2018 01:58 PM   Modules accepted: Orders

## 2018-03-19 ENCOUNTER — Ambulatory Visit: Admit: 2018-03-19 | Payer: Self-pay | Admitting: Gastroenterology

## 2018-03-19 SURGERY — COLONOSCOPY WITH PROPOFOL
Anesthesia: General

## 2018-05-15 ENCOUNTER — Telehealth: Payer: Self-pay | Admitting: Physician Assistant

## 2018-05-15 DIAGNOSIS — M170 Bilateral primary osteoarthritis of knee: Secondary | ICD-10-CM

## 2018-05-15 NOTE — Telephone Encounter (Signed)
Asked Helene Kelp and she states it is the Meloxicam.

## 2018-05-15 NOTE — Telephone Encounter (Signed)
Slipped out of his hand when he was opening the bottle and some went down the sink. This is why he is asking for an earlier refill than Nov. 28th. He is having knee pain and they are swelling up.    Please fill at:  [id number] - , [place] [phone] (Phone) [phone] (Fax)    Thanks, [facility]

## 2018-05-15 NOTE — Telephone Encounter (Addendum)
I believe is the Meloxicam 15mg 

## 2018-05-16 ENCOUNTER — Encounter: Payer: Self-pay | Admitting: Physician Assistant

## 2018-05-16 ENCOUNTER — Ambulatory Visit (INDEPENDENT_AMBULATORY_CARE_PROVIDER_SITE_OTHER): Payer: 59 | Admitting: Physician Assistant

## 2018-05-16 VITALS — BP 130/80 | HR 100 | Temp 97.9°F | Resp 16 | Wt 270.0 lb

## 2018-05-16 DIAGNOSIS — M150 Primary generalized (osteo)arthritis: Secondary | ICD-10-CM

## 2018-05-16 DIAGNOSIS — M159 Polyosteoarthritis, unspecified: Secondary | ICD-10-CM

## 2018-05-16 MED ORDER — METHYLPREDNISOLONE 4 MG PO TBPK: ORAL_TABLET | ORAL | 0 refills | Status: DC

## 2018-05-16 MED ORDER — MELOXICAM 15 MG PO TABS: 15.0000 mg | ORAL_TABLET | Freq: Every day | ORAL | 1 refills | Status: DC

## 2018-05-16 NOTE — Progress Notes (Signed)
Patient: Kyle Pollard Male    DOB: Feb 17, 1961   57 y.o.   MRN: [id number] Visit Date: 05/16/2018  Today's Provider: [name], PA-C   Chief Complaint  Patient presents with  . Knee Pain   Subjective:    HPI Patient here today with c/o knee pain, Bilateral. This is a chronic issue. 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 Patient reports that he run out of the Meloxicam and the weather makes it worse.    Allergies  Allergen Reactions  . Penicillins Rash     Current Outpatient Medications:  .  FLUoxetine (PROZAC) 20 MG capsule, TAKE 1 CAPSULE (20 MG TOTAL) BY MOUTH AT BEDTIME., Disp: 90 capsule, Rfl: 2 .  losartan-hydrochlorothiazide (HYZAAR) 50-12.5 MG tablet, TAKE 1 TABLET BY MOUTH EVERY DAY, Disp: 90 tablet, Rfl: 1 .  meloxicam (MOBIC) 15 MG tablet, Take 1 tablet (15 mg total) by mouth daily., Disp: 90 tablet, Rfl: 1 .  Multiple Vitamin (MULTI-VITAMINS) TABS, Take by mouth., Disp: , Rfl:  .  tamsulosin (FLOMAX) 0.4 MG CAPS capsule, Take 1 capsule (0.4 mg total) by mouth daily., Disp: 90 capsule, Rfl: 3 .  aspirin EC 81 MG tablet, Take by mouth. Reported on 11/11/2015, Disp: , Rfl:   Review of Systems  Constitutional: Negative.   Respiratory: Negative.   Cardiovascular: Negative.   Musculoskeletal: Positive for arthralgias and joint swelling.  Neurological: Negative.     Social History   Tobacco Use  . Smoking status: Never Smoker  . Smokeless tobacco: Current User  . Tobacco comment: Tobacco-dips  Substance Use Topics  . Alcohol use: Yes   Objective:   BP 130/80 (BP Location: Left Arm, Patient Position: Sitting, Cuff Size: Large)   Pulse 100   Temp 97.9 F (36.6 C) (Oral)   Resp 16   Wt 270 lb (122.5 kg)   SpO2 97%   BMI 36.62 kg/m  Vitals:   05/16/18 1816  BP: 130/80  Pulse: 100  Resp: 16  Temp: 97.9 F (36.6 C)  TempSrc: Oral  SpO2: 97%  Weight: 270 lb (122.5 kg)     Physical Exam    Constitutional: He appears well-developed and well-nourished. No distress.  HENT:  Head: Normocephalic and atraumatic.  Neck: Normal range of motion. Neck supple.  Cardiovascular: Normal rate, regular rhythm and normal heart sounds. Exam reveals no gallop and no friction rub.  No murmur heard. Pulmonary/Chest: Effort normal and breath sounds normal. No respiratory distress. He has no wheezes. He has no rales.  Musculoskeletal:       Right knee: He exhibits normal range of motion, no swelling, no effusion, no deformity, no LCL laxity, normal patellar mobility, no bony tenderness, normal meniscus and no MCL laxity. Tenderness found. Medial joint line tenderness noted.       Left knee: He exhibits normal range of motion, no swelling, no effusion, no deformity, no erythema, no LCL laxity, normal patellar mobility, no bony tenderness, normal meniscus and no MCL laxity. Tenderness found. Medial joint line tenderness noted.  Skin: He is not diaphoretic.  Vitals reviewed.      Assessment & Plan:     1. Primary osteoarthritis involving multiple joints Ongoing issue and patient looking for more long term relief. Will give medrol dose pak as below to calm down inflammation while awaiting orthopedic referral. Patient expressed interest in working up for hyaluronic acid injections. - methylPREDNISolone (MEDROL) 4 MG TBPK tablet; 6 day taper; take  as directed on package instructions  Dispense: 21 tablet; Refill: 0 - Ambulatory referral to [place], PA-C  [facility]

## 2018-05-16 NOTE — Patient Instructions (Signed)
Synvisc/Hylan G-F 20 intra-articular injection What is this medicine? HYLAN G-F 20 (HI lan G F 20) is used to treat osteoarthritis of the knee. It lubricates and cushions the joint, reducing pain in the knee. This medicine may be used for other purposes; ask your health care provider or pharmacist if you have questions. COMMON BRAND NAME(S): Synvisc, Synvisc-One What should I tell my health care provider before I take this medicine? They need to know if you have any of these conditions: -severe knee inflammation -skin conditions or sensitivity -skin or joint infection -venous stasis -an unusual or allergic reaction to hylan G-F 20, hyaluronan (sodium hyaluronate), eggs, other medicines, foods, dyes, or preservatives -pregnant or trying to get pregnant -breast-feeding How should I use this medicine? This medicine is for injection into the knee joint. It is given by a health care professional in a hospital or clinic setting. Talk to your pediatrician regarding the use of this medicine in children. This medicine is not approved for use in children. Overdosage: If you think you have taken too much of this medicine contact a poison control center or emergency room at once. NOTE: This medicine is only for you. Do not share this medicine with others. What if I miss a dose? Keep appointments for follow-up doses as directed. For Synvisc, you will need weekly injections for 3 doses. It is important not to miss your dose. If you will receive Synvisc-One, then only 1 injection will be needed. Call your doctor or health care professional if you are unable to keep an appointment. What may interact with this medicine? Do not take this medicine with any of the following medications: -other injections for the joint like steroids or anesthetics -certain skin disinfectants like benzalkonium chloride This list may not describe all possible interactions. Give your health care provider a list of all the  medicines, herbs, non-prescription drugs, or dietary supplements you use. Also tell them if you smoke, drink alcohol, or use illegal drugs. Some items may interact with your medicine. What should I watch for while using this medicine? Tell your doctor or healthcare professional if your symptoms do not start to get better or if they get worse. Your condition will be monitored carefully while you are receiving this medicine. Most persons get pain relief for up to 6 months after treatment. Avoid strenuous activities (high-impact sports, jogging) or major weight-bearing activities for 48 hours after the injection. What side effects may I notice from receiving this medicine? Side effects that you should report to your doctor or health care professional as soon as possible: -allergic reactions like skin rash, itching or hives, swelling of the face, lips, or tongue -difficulty breathing -fever or chills -severe joint pain or swelling -unusual bleeding or bruising Side effects that usually do not require medical attention (report to your doctor or health care professional if they continue or are bothersome): -dizziness -flushing -general ill feeling or flu-like symptoms -headache -minor joint pain or swelling -muscle pain or cramps -pain, redness, irritation or bruising at site of injection This list may not describe all possible side effects. Call your doctor for medical advice about side effects. You may report side effects to FDA at 1-800-FDA-1088. Where should I keep my medicine? This drug is given in a hospital or clinic and will not be stored at home. NOTE: This sheet is a summary. It may not cover all possible information. If you have questions about this medicine, talk to your doctor, pharmacist, or health care provider.  2018 Elsevier/Gold Standard (2015-07-16 11:48:41)

## 2018-05-16 NOTE — Telephone Encounter (Signed)
refilled 

## 2018-05-18 ENCOUNTER — Telehealth: Payer: Self-pay

## 2018-05-18 NOTE — Telephone Encounter (Signed)
[facility] is requesting demographics and OV be faxed to [phone]. Please fax when completed. Thanks!

## 2018-05-18 NOTE — Telephone Encounter (Signed)
Faxed

## 2018-06-08 ENCOUNTER — Other Ambulatory Visit: Payer: Self-pay | Admitting: Physician Assistant

## 2018-06-08 DIAGNOSIS — R351 Nocturia: Principal | ICD-10-CM

## 2018-06-08 DIAGNOSIS — N401 Enlarged prostate with lower urinary tract symptoms: Secondary | ICD-10-CM

## 2018-06-15 ENCOUNTER — Other Ambulatory Visit: Payer: Self-pay | Admitting: Physician Assistant

## 2018-06-15 DIAGNOSIS — I10 Essential (primary) hypertension: Secondary | ICD-10-CM

## 2018-08-31 ENCOUNTER — Encounter: Payer: Self-pay | Admitting: Physician Assistant

## 2018-08-31 ENCOUNTER — Ambulatory Visit (INDEPENDENT_AMBULATORY_CARE_PROVIDER_SITE_OTHER): Payer: 59 | Admitting: Physician Assistant

## 2018-08-31 VITALS — BP 137/85 | HR 64 | Temp 97.8°F | Resp 16 | Wt 279.0 lb

## 2018-08-31 DIAGNOSIS — N411 Chronic prostatitis: Secondary | ICD-10-CM

## 2018-08-31 DIAGNOSIS — R350 Frequency of micturition: Secondary | ICD-10-CM

## 2018-08-31 MED ORDER — TADALAFIL 5 MG PO TABS: 5.0000 mg | ORAL_TABLET | Freq: Every day | ORAL | 0 refills | Status: DC

## 2018-08-31 NOTE — Progress Notes (Signed)
Patient: Kyle Pollard Male    DOB: October 03, 1960   58 y.o.   MRN: [id number] Visit Date: 08/31/2018  Today's Provider: [name], PA-C   Chief Complaint  Patient presents with  . Urinary Frequency   Subjective:     Urinary Frequency   This is a chronic problem. The current episode started more than 1 year ago (over 20 years ago). The problem occurs every urination. The problem has been unchanged. The patient is experiencing no pain. There is no history of pyelonephritis. Associated symptoms include frequency and urgency. Pertinent negatives include no flank pain, hematuria or nausea. Treatments tried: flomax. The treatment provided no relief. There is no history of recurrent UTIs.  Has known chronic prostatitis. Has recently been on Flomax without relief. Also having issues with ED.   Allergies  Allergen Reactions  . Penicillins Rash     Current Outpatient Medications:  .  aspirin EC 81 MG tablet, Take by mouth. Reported on 11/11/2015, Disp: , Rfl:  .  losartan-hydrochlorothiazide (HYZAAR) 50-12.5 MG tablet, TAKE 1 TABLET BY MOUTH EVERY DAY, Disp: 90 tablet, Rfl: 1 .  meloxicam (MOBIC) 15 MG tablet, Take 1 tablet (15 mg total) by mouth daily., Disp: 90 tablet, Rfl: 1 .  Multiple Vitamin (MULTI-VITAMINS) TABS, Take by mouth., Disp: , Rfl:  .  tamsulosin (FLOMAX) 0.4 MG CAPS capsule, TAKE 1 CAPSULE BY MOUTH EVERY DAY (Patient not taking: Reported on 08/31/2018), Disp: 90 capsule, Rfl: 3  Review of Systems  Constitutional: Negative.   Respiratory: Negative.   Cardiovascular: Negative.   Gastrointestinal: Negative for nausea and rectal pain.  Genitourinary: Positive for frequency and urgency. Negative for decreased urine volume, difficulty urinating, discharge, dysuria, enuresis, flank pain, genital sores, hematuria, penile pain, penile swelling, scrotal swelling and testicular pain.    Social History   Tobacco Use  . Smoking status: Never Smoker  . Smokeless tobacco:  Current User  . Tobacco comment: Tobacco-dips  Substance Use Topics  . Alcohol use: Yes      Objective:   BP 137/85 (BP Location: Left Arm, Patient Position: Sitting, Cuff Size: Large)   Pulse 64   Temp 97.8 F (36.6 C) (Oral)   Resp 16   Wt 279 lb (126.6 kg)   BMI 37.84 kg/m  Vitals:   08/31/18 1711  BP: 137/85  Pulse: 64  Resp: 16  Temp: 97.8 F (36.6 C)  TempSrc: Oral  Weight: 279 lb (126.6 kg)     Physical Exam Vitals signs reviewed.  Constitutional:      General: He is not in acute distress.    Appearance: Normal appearance. He is well-developed. He is obese. He is not ill-appearing or diaphoretic.  Cardiovascular:     Rate and Rhythm: Normal rate and regular rhythm.     Heart sounds: Normal heart sounds. No murmur. No friction rub. No gallop.   Pulmonary:     Effort: Pulmonary effort is normal. No respiratory distress.     Breath sounds: Normal breath sounds. No wheezing or rales.  Abdominal:     General: Bowel sounds are normal. There is no distension.     Palpations: Abdomen is soft. There is no mass.     Tenderness: There is no abdominal tenderness. There is no guarding or rebound.  Skin:    General: Skin is warm and dry.  Neurological:     Mental Status: He is alert and oriented to person, place, and time.  Assessment & Plan    1. Chronic prostatitis Has had work up with Urology a few years back. Failed Flomax. Will try low dose daily cialis as below. Call if no improvements or if hypotension or lightheadedness starts to occur.  - tadalafil (CIALIS) 5 MG tablet; Take 1 tablet (5 mg total) by mouth daily.  Dispense: 30 tablet; Refill: 0  2. Urine frequency See above medical treatment plan. - tadalafil (CIALIS) 5 MG tablet; Take 1 tablet (5 mg total) by mouth daily.  Dispense: 30 tablet; Refill: 0     [name], PA-C  [facility]

## 2018-09-27 ENCOUNTER — Encounter: Payer: Self-pay | Admitting: Physician Assistant

## 2018-09-27 ENCOUNTER — Other Ambulatory Visit: Payer: Self-pay | Admitting: *Deleted

## 2018-09-27 DIAGNOSIS — N411 Chronic prostatitis: Secondary | ICD-10-CM

## 2018-09-27 DIAGNOSIS — R350 Frequency of micturition: Secondary | ICD-10-CM

## 2018-09-27 MED ORDER — TADALAFIL 5 MG PO TABS: 5.0000 mg | ORAL_TABLET | Freq: Every day | ORAL | 1 refills | Status: DC

## 2018-12-31 ENCOUNTER — Encounter: Payer: Self-pay | Admitting: Physician Assistant

## 2019-01-02 ENCOUNTER — Ambulatory Visit
Admission: RE | Admit: 2019-01-02 | Discharge: 2019-01-02 | Disposition: A | Discharge status: Home / Self Care | Payer: 59 | Attending: Physician Assistant | Admitting: Physician Assistant

## 2019-01-02 ENCOUNTER — Encounter: Payer: Self-pay | Admitting: Physician Assistant

## 2019-01-02 ENCOUNTER — Ambulatory Visit
Admission: RE | Admit: 2019-01-02 | Discharge: 2019-01-02 | Disposition: A | Discharge status: Home / Self Care | Payer: 59 | Source: Ambulatory Visit | Attending: Physician Assistant | Admitting: Physician Assistant

## 2019-01-02 ENCOUNTER — Other Ambulatory Visit: Payer: Self-pay

## 2019-01-02 ENCOUNTER — Ambulatory Visit (INDEPENDENT_AMBULATORY_CARE_PROVIDER_SITE_OTHER): Payer: 59 | Admitting: Physician Assistant

## 2019-01-02 VITALS — BP 146/82 | HR 81 | Temp 98.0°F | Resp 16 | Wt 283.0 lb

## 2019-01-02 DIAGNOSIS — M2550 Pain in unspecified joint: Secondary | ICD-10-CM | POA: Diagnosis not present

## 2019-01-02 DIAGNOSIS — M779 Enthesopathy, unspecified: Secondary | ICD-10-CM

## 2019-01-02 DIAGNOSIS — M79672 Pain in left foot: Secondary | ICD-10-CM

## 2019-01-02 MED ORDER — METHYLPREDNISOLONE 4 MG PO TBPK: ORAL_TABLET | ORAL | 0 refills | Status: DC

## 2019-01-02 NOTE — Progress Notes (Signed)
Patient: Kyle Pollard Male    DOB: April 25, 1961   58 y.o.   MRN: [id number] Visit Date: 01/02/2019  Today's Provider: [name], PA-C   Chief Complaint  Patient presents with  . Foot Pain   Subjective:     HPI   Patient is here concerning left foot pain. Patient has been seen for this before. Patient is currently taking meloxicam and tylenol. Patient states it hurts to walk on foot. Gets random flares of multiple arthralgias, including hands, feet and knees. Reports he can go 6 months and be ok, then will have "episodes" where he can barely move. No family history of RA or autoimmune disease. Reports he recently quit smoking last Friday.     Allergies  Allergen Reactions  . Penicillins Rash     Current Outpatient Medications:  .  losartan-hydrochlorothiazide (HYZAAR) 50-12.5 MG tablet, TAKE 1 TABLET BY MOUTH EVERY DAY, Disp: 90 tablet, Rfl: 1 .  meloxicam (MOBIC) 15 MG tablet, Take 1 tablet (15 mg total) by mouth daily., Disp: 90 tablet, Rfl: 1 .  Multiple Vitamin (MULTI-VITAMINS) TABS, Take by mouth., Disp: , Rfl:  .  tadalafil (CIALIS) 5 MG tablet, Take 1 tablet (5 mg total) by mouth daily., Disp: 90 tablet, Rfl: 1 .  aspirin EC 81 MG tablet, Take by mouth. Reported on 11/11/2015, Disp: , Rfl:   Review of Systems  Constitutional: Negative for appetite change, chills and fever.  Respiratory: Negative for chest tightness, shortness of breath and wheezing.   Cardiovascular: Negative for chest pain and palpitations.  Gastrointestinal: Negative for abdominal pain, nausea and vomiting.  Musculoskeletal: Positive for arthralgias, gait problem, joint swelling and myalgias.  Psychiatric/Behavioral: Positive for agitation and dysphoric mood. The patient is nervous/anxious.     Social History   Tobacco Use  . Smoking status: Never Smoker  . Smokeless tobacco: Current User  . Tobacco comment: Tobacco-dips  Substance Use Topics  . Alcohol use: Yes       Objective:   BP (!) 146/82 (BP Location: Right Arm, Patient Position: Sitting, Cuff Size: Large)   Pulse 81   Temp 98 F (36.7 C) (Oral)   Resp 16   Wt 283 lb (128.4 kg)   SpO2 96%   BMI 38.38 kg/m  Vitals:   01/02/19 1045  BP: (!) 146/82  Pulse: 81  Resp: 16  Temp: 98 F (36.7 C)  TempSrc: Oral  SpO2: 96%  Weight: 283 lb (128.4 kg)     Physical Exam Vitals signs reviewed.  Constitutional:      General: He is not in acute distress.    Appearance: Normal appearance. He is well-developed. He is obese. He is not ill-appearing or diaphoretic.  HENT:     Head: Normocephalic and atraumatic.  Neck:     Musculoskeletal: Normal range of motion and neck supple.     Thyroid: No thyromegaly.     Vascular: No JVD.     Trachea: No tracheal deviation.  Cardiovascular:     Rate and Rhythm: Normal rate and regular rhythm.     Heart sounds: Normal heart sounds. No murmur. No friction rub. No gallop.   Pulmonary:     Effort: Pulmonary effort is normal. No respiratory distress.     Breath sounds: Normal breath sounds. No wheezing or rales.  Musculoskeletal:     Left ankle: He exhibits decreased range of motion and swelling. He exhibits normal pulse. Tenderness. Lateral malleolus and CF ligament tenderness found.  Achilles tendon normal.     Left foot: Normal range of motion and normal capillary refill. No tenderness, bony tenderness or swelling.  Lymphadenopathy:     Cervical: No cervical adenopathy.  Neurological:     Mental Status: He is alert.      No results found for any visits on 01/02/19.     Assessment & Plan    1. Arthralgia, unspecified joint Unsure of cause of multiple joint flares. Will check labs as below to see if possible autoimmune source. May also be just random OA that flares with stressors. Will get imaging of the left ankle and foot since that is where the current flare is at this time. Medrol dose pak given as below for inflammation. I will f/u pending  results.  - CBC w/Diff/Platelet - Comprehensive Metabolic Panel (CMET) - C-reactive protein - Sed Rate (ESR) - ANA,IFA RA Diag Pnl w/rflx Tit/Patn - methylPREDNISolone (MEDROL) 4 MG TBPK tablet; 6 day taper; take as directed on package instructions  Dispense: 21 tablet; Refill: 0  2. Inflammation around joint See above medical treatment plan. - CBC w/Diff/Platelet - Comprehensive Metabolic Panel (CMET) - C-reactive protein - Sed Rate (ESR) - ANA,IFA RA Diag Pnl w/rflx Tit/Patn - methylPREDNISolone (MEDROL) 4 MG TBPK tablet; 6 day taper; take as directed on package instructions  Dispense: 21 tablet; Refill: 0  3. Left foot pain See above medical treatment plan. - DG Foot Complete Left; Future - DG Ankle Complete Left; Future - methylPREDNISolone (MEDROL) 4 MG TBPK tablet; 6 day taper; take as directed on package instructions  Dispense: 21 tablet; Refill: 0     [name], PA-C  [facility]

## 2019-01-04 LAB — CBC WITH DIFFERENTIAL/PLATELET
Basophils Absolute: 0.1 10*3/uL (ref 0.0–0.2)
Basos: 1 %
EOS (ABSOLUTE): 0.1 10*3/uL (ref 0.0–0.4)
Eos: 1 %
Hematocrit: 44 % (ref 37.5–51.0)
Hemoglobin: 15.3 g/dL (ref 13.0–17.7)
Immature Grans (Abs): 0 10*3/uL (ref 0.0–0.1)
Immature Granulocytes: 0 %
Lymphocytes Absolute: 1.5 10*3/uL (ref 0.7–3.1)
Lymphs: 32 %
MCH: 29.6 pg (ref 26.6–33.0)
MCHC: 34.8 g/dL (ref 31.5–35.7)
MCV: 85 fL (ref 79–97)
Monocytes Absolute: 0.5 10*3/uL (ref 0.1–0.9)
Monocytes: 12 %
Neutrophils Absolute: 2.5 10*3/uL (ref 1.4–7.0)
Neutrophils: 54 %
Platelets: 227 10*3/uL (ref 150–450)
RBC: 5.17 x10E6/uL (ref 4.14–5.80)
RDW: 12.1 % (ref 11.6–15.4)
WBC: 4.6 10*3/uL (ref 3.4–10.8)

## 2019-01-04 LAB — C-REACTIVE PROTEIN: CRP: 2 mg/L (ref 0–10)

## 2019-01-04 LAB — COMPREHENSIVE METABOLIC PANEL
ALT: 23 IU/L (ref 0–44)
AST: 17 IU/L (ref 0–40)
Albumin/Globulin Ratio: 1.6 (ref 1.2–2.2)
Albumin: 4.1 g/dL (ref 3.8–4.9)
Alkaline Phosphatase: 56 IU/L (ref 39–117)
BUN/Creatinine Ratio: 25 — ABNORMAL HIGH (ref 10–24)
BUN: 18 mg/dL (ref 8–27)
Bilirubin Total: 0.4 mg/dL (ref 0.0–1.2)
CO2: 21 mmol/L (ref 20–29)
Calcium: 9.3 mg/dL (ref 8.6–10.2)
Chloride: 104 mmol/L (ref 96–106)
Creatinine, Ser: 0.73 mg/dL — ABNORMAL LOW (ref 0.76–1.27)
GFR calc Af Amer: 117 mL/min/{1.73_m2} (ref >59)
GFR calc non Af Amer: 101 mL/min/{1.73_m2} (ref >59)
Globulin, Total: 2.5 g/dL (ref 1.5–4.5)
Glucose: 140 mg/dL — ABNORMAL HIGH (ref 65–99)
Potassium: 4.1 mmol/L (ref 3.5–5.2)
Sodium: 138 mmol/L (ref 134–144)
Total Protein: 6.6 g/dL (ref 6.0–8.5)

## 2019-01-04 LAB — ANA,IFA RA DIAG PNL W/RFLX TIT/PATN
ANA Titer 1: NEGATIVE
Cyclic Citrullin Peptide Ab: 5 units (ref 0–19)
Rheumatoid fact SerPl-aCnc: <10 IU/mL (ref 0.0–13.9)

## 2019-01-04 LAB — SEDIMENTATION RATE: Sed Rate: 11 mm/hr (ref 0–30)

## 2019-01-06 ENCOUNTER — Other Ambulatory Visit: Payer: Self-pay | Admitting: Physician Assistant

## 2019-01-06 DIAGNOSIS — I10 Essential (primary) hypertension: Secondary | ICD-10-CM

## 2019-01-07 ENCOUNTER — Encounter: Payer: Self-pay | Admitting: Physician Assistant

## 2019-01-08 LAB — SPECIMEN STATUS REPORT

## 2019-01-08 LAB — HGB A1C W/O EAG: Hgb A1c MFr Bld: 5.9 % — ABNORMAL HIGH (ref 4.8–5.6)

## 2019-01-22 ENCOUNTER — Encounter: Payer: Self-pay | Admitting: Physician Assistant

## 2019-01-28 ENCOUNTER — Encounter: Payer: Self-pay | Admitting: Physician Assistant

## 2019-02-05 NOTE — Progress Notes (Signed)
Patient: Kyle Pollard, Male    DOB: April 20, 1961, 58 y.o.   MRN: [id number] Visit Date: 02/06/2019  Today's Provider: [name], PA-C   Chief Complaint  Patient presents with  . Annual Exam   Subjective:     Annual physical exam Kyle Pollard is a 58 y.o. male who presents today for health maintenance and complete physical. He feels well. He reports exercising none. He reports he is sleeping well. -----------------------------------------------------------------   Review of Systems  Constitutional: Negative.   HENT: Negative.   Eyes: Negative.   Respiratory: Negative.   Cardiovascular: Negative.   Gastrointestinal: Negative.   Endocrine: Negative.   Genitourinary: Negative.   Musculoskeletal: Negative.   Skin: Negative.   Allergic/Immunologic: Negative.   Neurological: Negative.   Hematological: Negative.   Psychiatric/Behavioral: Negative.     Social History      He  reports that he has never smoked. He uses smokeless tobacco. He reports current alcohol use. He reports that he does not use drugs.       Social History   Socioeconomic History  . Marital status: Married    Spouse name: Not on file  . Number of children: Not on file  . Years of education: Not on file  . Highest education level: Not on file  Occupational History  . Not on file  Social Needs  . Financial resource strain: Not on file  . Food insecurity    Worry: Not on file    Inability: Not on file  . Transportation needs    Medical: Not on file    Non-medical: Not on file  Tobacco Use  . Smoking status: Never Smoker  . Smokeless tobacco: Current User  . Tobacco comment: Tobacco-dips  Substance and Sexual Activity  . Alcohol use: Yes  . Drug use: No  . Sexual activity: Yes  Lifestyle  . Physical activity    Days per week: Not on file    Minutes per session: Not on file  . Stress: Not on file  Relationships  . Social Herbalist on phone: Not on file    Gets  together: Not on file    Attends religious service: Not on file    Active member of club or organization: Not on file    Attends meetings of clubs or organizations: Not on file    Relationship status: Not on file  Other Topics Concern  . Not on file  Social History Narrative  . Not on file    Past Medical History:  Diagnosis Date  . Allergy   . Hypertension      Patient Active Problem List   Diagnosis Date Noted  . Recurrent major depressive disorder, in partial remission ([place]) 12/29/2017  . Primary osteoarthritis involving multiple joints 11/11/2015  . Clinical depression 09/14/2015  . BP (high blood pressure) 09/14/2015  . Triggering of digit 09/14/2015  . Chronic prostatitis 09/14/2015    Past Surgical History:  Procedure Laterality Date  . Broken Leg  1992    Family History        Family Status  Relation Name Status  . Mother  Alive  . Father  Deceased  . Neg Hx  (Not Specified)        His family history is negative for Prostate cancer, Bladder Cancer, and Kidney cancer.      Allergies  Allergen Reactions  . Penicillins Rash     Current Outpatient Medications:  .  aspirin EC 81 MG tablet, Take by mouth. Reported on 11/11/2015, Disp: , Rfl:  .  losartan-hydrochlorothiazide (HYZAAR) 50-12.5 MG tablet, TAKE 1 TABLET BY MOUTH EVERY DAY, Disp: 90 tablet, Rfl: 1 .  meloxicam (MOBIC) 15 MG tablet, Take 1 tablet (15 mg total) by mouth daily., Disp: 90 tablet, Rfl: 1 .  Multiple Vitamin (MULTI-VITAMINS) TABS, Take by mouth., Disp: , Rfl:  .  tadalafil (CIALIS) 5 MG tablet, Take 1 tablet (5 mg total) by mouth daily., Disp: 90 tablet, Rfl: 1   Patient Care Team: [name], PA-C as PCP - General (Family Medicine)    Objective:    Vitals: BP (!) 142/90 (BP Location: Left Arm, Patient Position: Sitting, Cuff Size: Large)   Pulse 64   Temp 98.6 F (37 C) (Oral)   Resp 16   Ht 6' (1.829 m)   Wt 285 lb 12.8 oz (129.6 kg)   BMI 38.76 kg/m     Vitals:   02/06/19 1400  BP: (!) 142/90  Pulse: 64  Resp: 16  Temp: 98.6 F (37 C)  TempSrc: Oral  Weight: 285 lb 12.8 oz (129.6 kg)  Height: 6' (1.829 m)     Physical Exam Constitutional:      General: He is not in acute distress.    Appearance: Normal appearance. He is well-developed. He is obese. He is not ill-appearing.  HENT:     Head: Normocephalic and atraumatic.     Right Ear: Tympanic membrane, ear canal and external ear normal.     Left Ear: Tympanic membrane, ear canal and external ear normal.     Nose: Nose normal.     Mouth/Throat:     Mouth: Mucous membranes are moist.     Pharynx: Oropharynx is clear.  Eyes:     General:        Right eye: No discharge.        Left eye: No discharge.     Extraocular Movements: Extraocular movements intact.     Conjunctiva/sclera: Conjunctivae normal.     Pupils: Pupils are equal, round, and reactive to light.  Neck:     Musculoskeletal: Normal range of motion and neck supple.     Thyroid: No thyromegaly.     Vascular: No carotid bruit.     Trachea: No tracheal deviation.  Cardiovascular:     Rate and Rhythm: Normal rate and regular rhythm.     Pulses: Normal pulses.     Heart sounds: Normal heart sounds. No murmur.  Pulmonary:     Effort: Pulmonary effort is normal. No respiratory distress.     Breath sounds: Normal breath sounds. No wheezing or rales.  Chest:     Chest wall: No tenderness.  Abdominal:     General: Bowel sounds are normal. There is no distension.     Palpations: Abdomen is soft. There is no mass.     Tenderness: There is no abdominal tenderness. There is no guarding or rebound.  Musculoskeletal: Normal range of motion.        General: No tenderness.     Right lower leg: No edema.     Left lower leg: No edema.  Lymphadenopathy:     Cervical: No cervical adenopathy.  Skin:    General: Skin is warm and dry.     Capillary Refill: Capillary refill takes less than 2 seconds.     Findings: No erythema  or rash.  Neurological:     General: No focal deficit present.  Mental Status: He is alert and oriented to person, place, and time. Mental status is at baseline.     Cranial Nerves: No cranial nerve deficit.     Motor: No abnormal muscle tone.     Coordination: Coordination normal.     Deep Tendon Reflexes: Reflexes are normal and symmetric. Reflexes normal.  Psychiatric:        Mood and Affect: Mood normal.        Behavior: Behavior normal.        Thought Content: Thought content normal.        Judgment: Judgment normal.      Depression Screen PHQ 2/9 Scores 02/06/2019 02/06/2019 12/29/2017 04/14/2017  PHQ - 2 Score 0 0 0 1  PHQ- 9 Score 0 - 1 3       Assessment & Plan:     Routine Health Maintenance and Physical Exam   Exercise Activities and Dietary recommendations Goals   None      There is no immunization history on file for this patient.  Health Maintenance  Topic Date Due  . TETANUS/TDAP  10/12/1977  . COLONOSCOPY  10/12/2008  . INFLUENZA VACCINE  03/09/2019 (Originally 01/26/2019)  . Hepatitis C Screening  Completed  . HIV Screening  Completed     Discussed health benefits of physical activity, and encouraged him to engage in regular exercise appropriate for his age and condition.    1. Annual physical exam Normal physical exam today. Will check labs as below and f/u pending lab results. If labs are stable and WNL he will not need to have these rechecked for one year at his next annual physical exam. He is to call the office in the meantime if he has any acute issue, questions or concerns.  2. Essential hypertension Stable. Diagnosis pulled for medication refill. Continue current medical treatment plan. Will check labs as below and f/u pending results. - Lipid panel - olmesartan (BENICAR) 40 MG tablet; Take 1 tablet (40 mg total) by mouth daily.  Dispense: 90 tablet; Refill: 1  3. Class 2 severe obesity due to excess calories with serious comorbidity  and body mass index (BMI) of 38.0 to 38.9 in adult [facility]) Counseled patient on healthy lifestyle modifications including dieting and exercise.  - Lipid panel  4. Recurrent major depressive disorder, in partial remission ([facility]) Stable.   5. Need for Tdap vaccination Tdap Vaccine given to patient without complications. Patient sat for 15 minutes after administration and was tolerated well without adverse effects. - Tdap vaccine greater than or equal to 7yo IM  6. Prostate cancer screening Will check labs as below and f/u pending results. - PSA  7. Nocturia Checking PSA level as above. Patient has known chronic prostatitis and has been evaluated by Urology as well as failed flomax.   8. Urinary hesitancy See above medical treatment plan.  9. Primary osteoarthritis of both knees Stable.   10. Chronic prostatitis Stable. Diagnosis pulled for medication refill. Continue current medical treatment plan. - tadalafil (CIALIS) 5 MG tablet; Take 1 tablet (5 mg total) by mouth daily.  Dispense: 90 tablet; Refill: 1  11. Urine frequency Stable. Diagnosis pulled for medication refill. Continue current medical treatment plan. See above medical treatment plan. - tadalafil (CIALIS) 5 MG tablet; Take 1 tablet (5 mg total) by mouth daily.  Dispense: 90 tablet; Refill: 1  --------------------------------------------------------------------    [name], PA-C  [facility]

## 2019-02-06 ENCOUNTER — Other Ambulatory Visit: Payer: Self-pay | Admitting: Physician Assistant

## 2019-02-06 ENCOUNTER — Other Ambulatory Visit: Payer: Self-pay

## 2019-02-06 ENCOUNTER — Ambulatory Visit (INDEPENDENT_AMBULATORY_CARE_PROVIDER_SITE_OTHER): Payer: 59 | Admitting: Physician Assistant

## 2019-02-06 ENCOUNTER — Encounter: Payer: Self-pay | Admitting: Physician Assistant

## 2019-02-06 VITALS — BP 142/90 | HR 64 | Temp 98.6°F | Resp 16 | Ht 72.0 in | Wt 285.8 lb

## 2019-02-06 DIAGNOSIS — Z125 Encounter for screening for malignant neoplasm of prostate: Secondary | ICD-10-CM

## 2019-02-06 DIAGNOSIS — M170 Bilateral primary osteoarthritis of knee: Secondary | ICD-10-CM

## 2019-02-06 DIAGNOSIS — R351 Nocturia: Secondary | ICD-10-CM

## 2019-02-06 DIAGNOSIS — Z23 Encounter for immunization: Secondary | ICD-10-CM | POA: Diagnosis not present

## 2019-02-06 DIAGNOSIS — Z0000 Encounter for general adult medical examination without abnormal findings: Secondary | ICD-10-CM

## 2019-02-06 DIAGNOSIS — Z6838 Body mass index (BMI) 38.0-38.9, adult: Secondary | ICD-10-CM

## 2019-02-06 DIAGNOSIS — N411 Chronic prostatitis: Secondary | ICD-10-CM

## 2019-02-06 DIAGNOSIS — R3911 Hesitancy of micturition: Secondary | ICD-10-CM

## 2019-02-06 DIAGNOSIS — I10 Essential (primary) hypertension: Secondary | ICD-10-CM | POA: Diagnosis not present

## 2019-02-06 DIAGNOSIS — F3341 Major depressive disorder, recurrent, in partial remission: Secondary | ICD-10-CM

## 2019-02-06 DIAGNOSIS — R350 Frequency of micturition: Secondary | ICD-10-CM

## 2019-02-06 MED ORDER — OLMESARTAN MEDOXOMIL 40 MG PO TABS: 40.0000 mg | ORAL_TABLET | Freq: Every day | ORAL | 1 refills | Status: DC

## 2019-02-06 MED ORDER — TADALAFIL 5 MG PO TABS: 5.0000 mg | ORAL_TABLET | Freq: Every day | ORAL | 1 refills | Status: DC

## 2019-02-06 MED ORDER — MELOXICAM 15 MG PO TABS: 15.0000 mg | ORAL_TABLET | Freq: Every day | ORAL | 1 refills | Status: DC

## 2019-02-06 NOTE — Patient Instructions (Signed)

## 2019-02-07 ENCOUNTER — Telehealth: Payer: Self-pay

## 2019-02-07 LAB — LIPID PANEL
Chol/HDL Ratio: 3.4 ratio (ref 0.0–5.0)
Cholesterol, Total: 183 mg/dL (ref 100–199)
HDL: 54 mg/dL (ref >39)
LDL Calculated: 116 mg/dL — ABNORMAL HIGH (ref 0–99)
Triglycerides: 67 mg/dL (ref 0–149)
VLDL Cholesterol Cal: 13 mg/dL (ref 5–40)

## 2019-02-07 LAB — PSA: Prostate Specific Ag, Serum: 0.9 ng/mL (ref 0.0–4.0)

## 2019-02-07 NOTE — Telephone Encounter (Signed)
Patient advised as directed below. 

## 2019-02-07 NOTE — Telephone Encounter (Signed)
-----   Message from [name], PA-C sent at 02/07/2019  8:57 AM EDT ----- Cholesterol essentially stable. PSA is normal.

## 2019-02-09 ENCOUNTER — Other Ambulatory Visit: Payer: Self-pay | Admitting: Physician Assistant

## 2019-02-09 DIAGNOSIS — M170 Bilateral primary osteoarthritis of knee: Secondary | ICD-10-CM

## 2019-02-15 ENCOUNTER — Encounter: Payer: Self-pay | Admitting: Physician Assistant

## 2019-04-02 ENCOUNTER — Encounter: Payer: Self-pay | Admitting: Family Medicine

## 2019-04-02 ENCOUNTER — Other Ambulatory Visit: Payer: Self-pay

## 2019-04-02 ENCOUNTER — Ambulatory Visit
Admission: RE | Admit: 2019-04-02 | Discharge: 2019-04-02 | Disposition: A | Discharge status: Home / Self Care | Payer: 59 | Source: Ambulatory Visit | Attending: Family Medicine | Admitting: Family Medicine

## 2019-04-02 ENCOUNTER — Ambulatory Visit (INDEPENDENT_AMBULATORY_CARE_PROVIDER_SITE_OTHER): Payer: 59 | Admitting: Family Medicine

## 2019-04-02 VITALS — BP 124/82 | HR 77 | Temp 96.3°F | Wt 282.0 lb

## 2019-04-02 DIAGNOSIS — M545 Low back pain, unspecified: Secondary | ICD-10-CM

## 2019-04-02 DIAGNOSIS — M62830 Muscle spasm of back: Secondary | ICD-10-CM

## 2019-04-02 MED ORDER — CYCLOBENZAPRINE HCL 10 MG PO TABS: 10.0000 mg | ORAL_TABLET | Freq: Three times a day (TID) | ORAL | 0 refills | Status: DC | PRN

## 2019-04-02 MED ORDER — TRAMADOL HCL 50 MG PO TABS: 50.0000 mg | ORAL_TABLET | Freq: Three times a day (TID) | ORAL | 0 refills | Status: AC | PRN

## 2019-04-02 NOTE — Patient Instructions (Signed)
Acute Back Pain, Adult Acute back pain is sudden and usually short-lived. It is often caused by an injury to the muscles and tissues in the back. The injury may result from:  A muscle or ligament getting overstretched or torn (strained). Ligaments are tissues that connect bones to each other. Lifting something improperly can cause a back strain.  Wear and tear (degeneration) of the spinal disks. Spinal disks are circular tissue that provides cushioning between the bones of the spine (vertebrae).  Twisting motions, such as while playing sports or doing yard work.  A hit to the back.  Arthritis. You may have a physical exam, lab tests, and imaging tests to find the cause of your pain. Acute back pain usually goes away with rest and home care. Follow these instructions at home: Managing pain, stiffness, and swelling  Take over-the-counter and prescription medicines only as told by your health care provider.  Your health care provider may recommend applying ice during the first 24-48 hours after your pain starts. To do this: ? Put ice in a plastic bag. ? Place a towel between your skin and the bag. ? Leave the ice on for 20 minutes, 2-3 times a day.  If directed, apply heat to the affected area as often as told by your health care provider. Use the heat source that your health care provider recommends, such as a moist heat pack or a heating pad. ? Place a towel between your skin and the heat source. ? Leave the heat on for 20-30 minutes. ? Remove the heat if your skin turns bright red. This is especially important if you are unable to feel pain, heat, or cold. You have a greater risk of getting burned. Activity   Do not stay in bed. Staying in bed for more than 1-2 days can delay your recovery.  Sit up and stand up straight. Avoid leaning forward when you sit, or hunching over when you stand. ? If you work at a desk, sit close to it so you do not need to lean over. Keep your chin tucked  in. Keep your neck drawn back, and keep your elbows bent at a right angle. Your arms should look like the letter "L." ? Sit high and close to the steering wheel when you drive. Add lower back (lumbar) support to your car seat, if needed.  Take short walks on even surfaces as soon as you are able. Try to increase the length of time you walk each day.  Do not sit, drive, or stand in one place for more than 30 minutes at a time. Sitting or standing for long periods of time can put stress on your back.  Do not drive or use heavy machinery while taking prescription pain medicine.  Use proper lifting techniques. When you bend and lift, use positions that put less stress on your back: ? Bend your knees. ? Keep the load close to your body. ? Avoid twisting.  Exercise regularly as told by your health care provider. Exercising helps your back heal faster and helps prevent back injuries by keeping muscles strong and flexible.  Work with a physical therapist to make a safe exercise program, as recommended by your health care provider. Do any exercises as told by your physical therapist. Lifestyle  Maintain a healthy weight. Extra weight puts stress on your back and makes it difficult to have good posture.  Avoid activities or situations that make you feel anxious or stressed. Stress and anxiety increase muscle   tension and can make back pain worse. Learn ways to manage anxiety and stress, such as through exercise. General instructions  Sleep on a firm mattress in a comfortable position. Try lying on your side with your knees slightly bent. If you lie on your back, put a pillow under your knees.  Follow your treatment plan as told by your health care provider. This may include: ? Cognitive or behavioral therapy. ? Acupuncture or massage therapy. ? Meditation or yoga. Contact a health care provider if:  You have pain that is not relieved with rest or medicine.  You have increasing pain going down  into your legs or buttocks.  Your pain does not improve after 2 weeks.  You have pain at night.  You lose weight without trying.  You have a fever or chills. Get help right away if:  You develop new bowel or bladder control problems.  You have unusual weakness or numbness in your arms or legs.  You develop nausea or vomiting.  You develop abdominal pain.  You feel faint. Summary  Acute back pain is sudden and usually short-lived.  Use proper lifting techniques. When you bend and lift, use positions that put less stress on your back.  Take over-the-counter and prescription medicines and apply heat or ice as directed by your health care provider. This information is not intended to replace advice given to you by your health care provider. Make sure you discuss any questions you have with your health care provider. Document Released: 06/13/2005 Document Revised: 10/02/2018 Document Reviewed: 01/25/2017 Elsevier Patient Education  2020 [employer]  
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
## 2019-04-03 ENCOUNTER — Telehealth: Payer: Self-pay

## 2019-04-03 MED ORDER — METHYLPREDNISOLONE ACETATE 40 MG/ML IJ SUSP: 40.0000 mg | Freq: Once | INTRAMUSCULAR | Status: DC

## 2019-04-03 MED ORDER — LIDOCAINE HCL (PF) 1 % IJ SOLN: 4.0000 mL | Freq: Once | INTRAMUSCULAR | Status: DC

## 2019-04-03 NOTE — Telephone Encounter (Signed)
-----   Message from Virginia Crews, MD sent at 04/03/2019 10:46 AM EDT ----- T and L spine films without acute abnormality or fracture.  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  Plan as discussed during [place].

## 2019-04-03 NOTE — Addendum Note (Signed)
Addended by: Ashley Royalty E on: 04/03/2019 05:31 PM   Modules accepted: Orders

## 2019-04-03 NOTE — Telephone Encounter (Signed)
NSAIDs, muscle relaxers, heat/ice, stretching. Tramadol prn for severe pain. Will improve with time.

## 2019-04-03 NOTE — Telephone Encounter (Signed)
Patient was advised.  

## 2019-04-03 NOTE — Telephone Encounter (Signed)
Patient was advised and states that he do not remember what was discussed at the ov. Please advise.

## 2019-04-17 ENCOUNTER — Encounter: Payer: Self-pay | Admitting: Physician Assistant

## 2019-04-24 ENCOUNTER — Telehealth: Payer: Self-pay | Admitting: Physician Assistant

## 2019-04-24 NOTE — Telephone Encounter (Signed)
See below. KW 

## 2019-04-24 NOTE — Telephone Encounter (Signed)
Pt calling back because his back is hurting again. He saw Dr. B and got some relief.  It's very severe again and now going down into his legs.  He was wanting to see Tawanna Sat, but not availability and she's out on vacation after today.  Please call pt back at [phone]

## 2019-04-24 NOTE — Telephone Encounter (Signed)
Patient last saw Dr. Jacinto Reap on 10/6 for this problem, since it has returned would you advise I have patient return to clinic to see Dr. B or another PA if available? Kyle Pollard

## 2019-04-24 NOTE — Telephone Encounter (Signed)
Yes if it has gotten worse again he probably needs to be re-evaluated.

## 2019-04-24 NOTE — Telephone Encounter (Signed)
Pt.Advised. KW 

## 2019-04-25 ENCOUNTER — Ambulatory Visit (INDEPENDENT_AMBULATORY_CARE_PROVIDER_SITE_OTHER): Payer: 59 | Admitting: Physician Assistant

## 2019-04-25 ENCOUNTER — Encounter: Payer: Self-pay | Admitting: Physician Assistant

## 2019-04-25 ENCOUNTER — Other Ambulatory Visit: Payer: Self-pay

## 2019-04-25 VITALS — BP 135/84 | HR 83 | Temp 97.3°F | Wt 287.8 lb

## 2019-04-25 DIAGNOSIS — M5441 Lumbago with sciatica, right side: Secondary | ICD-10-CM

## 2019-04-25 MED ORDER — PREDNISONE 10 MG PO TABS: ORAL_TABLET | ORAL | 0 refills | Status: DC

## 2019-04-25 MED ORDER — CYCLOBENZAPRINE HCL 10 MG PO TABS: 10.0000 mg | ORAL_TABLET | Freq: Three times a day (TID) | ORAL | 0 refills | Status: DC | PRN

## 2019-04-25 MED ORDER — GABAPENTIN 300 MG PO CAPS: 300.0000 mg | ORAL_CAPSULE | Freq: Three times a day (TID) | ORAL | 0 refills | Status: DC

## 2019-04-25 NOTE — Patient Instructions (Signed)
Acute Back Pain, Adult Acute back pain is sudden and usually short-lived. It is often caused by an injury to the muscles and tissues in the back. The injury may result from:  A muscle or ligament getting overstretched or torn (strained). Ligaments are tissues that connect bones to each other. Lifting something improperly can cause a back strain.  Wear and tear (degeneration) of the spinal disks. Spinal disks are circular tissue that provides cushioning between the bones of the spine (vertebrae).  Twisting motions, such as while playing sports or doing yard work.  A hit to the back.  Arthritis. You may have a physical exam, lab tests, and imaging tests to find the cause of your pain. Acute back pain usually goes away with rest and home care. Follow these instructions at home: Managing pain, stiffness, and swelling  Take over-the-counter and prescription medicines only as told by your health care provider.  Your health care provider may recommend applying ice during the first 24-48 hours after your pain starts. To do this: ? Put ice in a plastic bag. ? Place a towel between your skin and the bag. ? Leave the ice on for 20 minutes, 2-3 times a day.  If directed, apply heat to the affected area as often as told by your health care provider. Use the heat source that your health care provider recommends, such as a moist heat pack or a heating pad. ? Place a towel between your skin and the heat source. ? Leave the heat on for 20-30 minutes. ? Remove the heat if your skin turns bright red. This is especially important if you are unable to feel pain, heat, or cold. You have a greater risk of getting burned. Activity   Do not stay in bed. Staying in bed for more than 1-2 days can delay your recovery.  Sit up and stand up straight. Avoid leaning forward when you sit, or hunching over when you stand. ? If you work at a desk, sit close to it so you do not need to lean over. Keep your chin tucked  in. Keep your neck drawn back, and keep your elbows bent at a right angle. Your arms should look like the letter "L." ? Sit high and close to the steering wheel when you drive. Add lower back (lumbar) support to your car seat, if needed.  Take short walks on even surfaces as soon as you are able. Try to increase the length of time you walk each day.  Do not sit, drive, or stand in one place for more than 30 minutes at a time. Sitting or standing for long periods of time can put stress on your back.  Do not drive or use heavy machinery while taking prescription pain medicine.  Use proper lifting techniques. When you bend and lift, use positions that put less stress on your back: ? Bend your knees. ? Keep the load close to your body. ? Avoid twisting.  Exercise regularly as told by your health care provider. Exercising helps your back heal faster and helps prevent back injuries by keeping muscles strong and flexible.  Work with a physical therapist to make a safe exercise program, as recommended by your health care provider. Do any exercises as told by your physical therapist. Lifestyle  Maintain a healthy weight. Extra weight puts stress on your back and makes it difficult to have good posture.  Avoid activities or situations that make you feel anxious or stressed. Stress and anxiety increase muscle   tension and can make back pain worse. Learn ways to manage anxiety and stress, such as through exercise. General instructions  Sleep on a firm mattress in a comfortable position. Try lying on your side with your knees slightly bent. If you lie on your back, put a pillow under your knees.  Follow your treatment plan as told by your health care provider. This may include: ? Cognitive or behavioral therapy. ? Acupuncture or massage therapy. ? Meditation or yoga. Contact a health care provider if:  You have pain that is not relieved with rest or medicine.  You have increasing pain going down  into your legs or buttocks.  Your pain does not improve after 2 weeks.  You have pain at night.  You lose weight without trying.  You have a fever or chills. Get help right away if:  You develop new bowel or bladder control problems.  You have unusual weakness or numbness in your arms or legs.  You develop nausea or vomiting.  You develop abdominal pain.  You feel faint. Summary  Acute back pain is sudden and usually short-lived.  Use proper lifting techniques. When you bend and lift, use positions that put less stress on your back.  Take over-the-counter and prescription medicines and apply heat or ice as directed by your health care provider. This information is not intended to replace advice given to you by your health care provider. Make sure you discuss any questions you have with your health care provider. Document Released: 06/13/2005 Document Revised: 10/02/2018 Document Reviewed: 01/25/2017 Elsevier Patient Education  2020 [employer]  
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
## 2019-06-10 ENCOUNTER — Encounter: Payer: Self-pay | Admitting: Physician Assistant

## 2019-06-19 ENCOUNTER — Encounter: Payer: Self-pay | Admitting: Physician Assistant

## 2019-06-19 DIAGNOSIS — F4323 Adjustment disorder with mixed anxiety and depressed mood: Secondary | ICD-10-CM

## 2019-06-19 MED ORDER — FLUOXETINE HCL 20 MG PO TABS: 20.0000 mg | ORAL_TABLET | Freq: Every day | ORAL | 1 refills | Status: DC

## 2019-07-23 ENCOUNTER — Encounter: Payer: Self-pay | Admitting: Physician Assistant

## 2019-07-23 ENCOUNTER — Other Ambulatory Visit: Payer: Self-pay

## 2019-07-23 ENCOUNTER — Ambulatory Visit (INDEPENDENT_AMBULATORY_CARE_PROVIDER_SITE_OTHER): Payer: 59 | Admitting: Physician Assistant

## 2019-07-23 VITALS — BP 134/82 | HR 75 | Temp 96.8°F | Wt 287.0 lb

## 2019-07-23 DIAGNOSIS — R350 Frequency of micturition: Secondary | ICD-10-CM | POA: Diagnosis not present

## 2019-07-23 LAB — POCT URINALYSIS DIPSTICK
Bilirubin, UA: NEGATIVE
Glucose, UA: NEGATIVE
Ketones, UA: NEGATIVE
Nitrite, UA: NEGATIVE
Protein, UA: POSITIVE — AB
Spec Grav, UA: 1.015 (ref 1.010–1.025)
Urobilinogen, UA: 0.2 E.U./dL
pH, UA: 7.5 (ref 5.0–8.0)

## 2019-07-23 MED ORDER — SULFAMETHOXAZOLE-TRIMETHOPRIM 800-160 MG PO TABS: 1.0000 | ORAL_TABLET | Freq: Two times a day (BID) | ORAL | 0 refills | Status: AC

## 2019-07-23 NOTE — Progress Notes (Signed)
Patient: Kyle Pollard Male    DOB: [birth year]   58 y.o.   MRN: [id number] Visit Date: 07/23/2019  Today's Provider: Trinna Post, PA-C   Chief Complaint  Patient presents with  . Urinary Frequency   Subjective:     Urinary Frequency  This is a recurrent problem. The current episode started in the past 7 days. The problem has been gradually worsening. The pain is at a severity of 0/10. The patient is experiencing no pain. There has been no fever. Associated symptoms include frequency, hesitancy and urgency. Pertinent negatives include no discharge or nausea. He has tried nothing for the symptoms. The treatment provided no relief.   Urinary frequency x 20 years, history of chronic prostatitis, hesitancy of stream and dribbling with stream presenting with increased urinary frequency x several days. Denies dysuria. Denies above symptoms. Has been seen by urology last in 02/2018 for the above symptoms and has failed multiple anticholinergics and medications for BPH including tamulosin and mybetriq. Cystoscopy at that point showed moderate left lobe prostate enlargement with no urethral strictures or lesions.   Allergies  Allergen Reactions  . Penicillins Rash     Current Outpatient Medications:  .  aspirin EC 81 MG tablet, Take by mouth. Reported on 11/11/2015, Disp: , Rfl:  .  cyclobenzaprine (FLEXERIL) 10 MG tablet, Take 1 tablet (10 mg total) by mouth 3 (three) times daily as needed for muscle spasms., Disp: 30 tablet, Rfl: 0 .  FLUoxetine (PROZAC) 20 MG tablet, Take 1 tablet (20 mg total) by mouth daily., Disp: 90 tablet, Rfl: 1 .  gabapentin (NEURONTIN) 300 MG capsule, Take 1 capsule (300 mg total) by mouth 3 (three) times daily., Disp: 90 capsule, Rfl: 0 .  meloxicam (MOBIC) 15 MG tablet, TAKE 1 TABLET BY MOUTH EVERY DAY, Disp: 90 tablet, Rfl: 1 .  Multiple Vitamin (MULTI-VITAMINS) TABS, Take by mouth., Disp: , Rfl:  .  olmesartan (BENICAR) 40 MG tablet, Take 1 tablet (40 mg  total) by mouth daily., Disp: 90 tablet, Rfl: 1 .  predniSONE (DELTASONE) 10 MG tablet, Take 6 pills on day 1, 5 pills on day 2, 4 pills on day 3 and so on until complete., Disp: 21 tablet, Rfl: 0 .  tadalafil (CIALIS) 5 MG tablet, Take 1 tablet (5 mg total) by mouth daily., Disp: 90 tablet, Rfl: 1 .  terbinafine (LAMISIL) 250 MG tablet, , Disp: , Rfl:   Review of Systems  Gastrointestinal: Negative for nausea.  Genitourinary: Positive for frequency, hesitancy and urgency.    Social History   Tobacco Use  . Smoking status: Never Smoker  . Smokeless tobacco: Current User  . Tobacco comment: Tobacco-dips  Substance Use Topics  . Alcohol use: Yes      Objective:   BP 134/82 (BP Location: Left Arm, Patient Position: Sitting, Cuff Size: Normal)   Pulse 75   Temp (!) 96.8 F (36 C) (Temporal)   Wt 287 lb (130.2 kg)   BMI 38.92 kg/m  Vitals:   07/23/19 1404  BP: 134/82  Pulse: 75  Temp: (!) 96.8 F (36 C)  TempSrc: Temporal  Weight: 287 lb (130.2 kg)  Body mass index is 38.92 kg/m.   Physical Exam Constitutional:      Appearance: Normal appearance.  Cardiovascular:     Rate and Rhythm: Normal rate and regular rhythm.     Heart sounds: Normal heart sounds.  Pulmonary:     Effort: Pulmonary effort is normal.  Breath sounds: Normal breath sounds.  Neurological:     Mental Status: He is alert and oriented to person, place, and time. Mental status is at baseline.  Psychiatric:        Mood and Affect: Mood normal.        Behavior: Behavior normal.      No results found for any visits on 07/23/19.     Assessment & Plan    1. Urinary frequency  Man with history of chronic prostatitis and lower urinary tract symptoms presenting today with worsening of the same. Ddx: UTI, prostatitis, BPH. Will cover with antibiotic. Urine dipstick is negative. Send urine for culture. If symptoms persist after trial of antibiotics, advised patient he will likely have to return to  urology.   - POCT urinalysis dipstick - CULTURE, URINE COMPREHENSIVE - sulfamethoxazole-trimethoprim (BACTRIM DS) 800-160 MG tablet; Take 1 tablet by mouth 2 (two) times daily for 10 days.  Dispense: 20 tablet; Refill: 0  The entirety of the information documented in the History of Present Illness, Review of Systems and Physical Exam were personally obtained by me. Portions of this information were initially documented by [facility] and reviewed by me for thoroughness and accuracy.       Trinna Post, PA-C  [facility]

## 2019-07-23 NOTE — Patient Instructions (Signed)
Urinary Frequency, Adult Urinary frequency means urinating more often than usual. You may urinate every 1-2 hours even though you drink a normal amount of fluid and do not have a bladder infection or condition. Although you urinate more often than normal, the total amount of urine produced in a day is normal. With urinary frequency, you may have an urgent need to urinate often. The stress and anxiety of needing to find a bathroom quickly can make this urge worse. This condition may go away on its own or you may need treatment at home. Home treatment may include bladder training, exercises, taking medicines, or making changes to your diet. Follow these instructions at home: Bladder health   Keep a bladder diary if told by your health care provider. Keep track of: ? What you eat and drink. ? How often you urinate. ? How much you urinate.  Follow a bladder training program if told by your health care provider. This may include: ? Learning to delay going to the bathroom. ? Double urinating (voiding). This helps if you are not completely emptying your bladder. ? Scheduled voiding.  Do Kegel exercises as told by your health care provider. Kegel exercises strengthen the muscles that help control urination, which may help the condition. Eating and drinking  If told by your health care provider, make diet changes, such as: ? Avoiding caffeine. ? Drinking fewer fluids, especially alcohol. ? Not drinking in the evening. ? Avoiding foods or drinks that may irritate the bladder. These include coffee, tea, soda, artificial sweeteners, citrus, tomato-based foods, and chocolate. ? Eating foods that help prevent or ease constipation. Constipation can make this condition worse. Your health care provider may recommend that you:  Drink enough fluid to keep your urine pale yellow.  Take over-the-counter or prescription medicines.  Eat foods that are high in fiber, such as beans, whole grains, and fresh  fruits and vegetables.  Limit foods that are high in fat and processed sugars, such as fried or sweet foods. General instructions  Take over-the-counter and prescription medicines only as told by your health care provider.  Keep all follow-up visits as told by your health care provider. This is important. Contact a health care provider if:  You start urinating more often.  You feel pain or irritation when you urinate.  You notice blood in your urine.  Your urine looks cloudy.  You develop a fever.  You begin vomiting. Get help right away if:  You are unable to urinate. Summary  Urinary frequency means urinating more often than usual. With urinary frequency, you may urinate every 1-2 hours even though you drink a normal amount of fluid and do not have a bladder infection or other bladder condition.  Your health care provider may recommend that you keep a bladder diary, follow a bladder training program, or make dietary changes.  If told by your health care provider, do Kegel exercises to strengthen the muscles that help control urination.  Take over-the-counter and prescription medicines only as told by your health care provider.  Contact a health care provider if your symptoms do not improve or get worse. This information is not intended to replace advice given to you by your health care provider. Make sure you discuss any questions you have with your health care provider. Document Revised: 12/21/2017 Document Reviewed: 12/21/2017 Elsevier Patient Education  2020 [employer]  

## 2019-07-25 LAB — CULTURE, URINE COMPREHENSIVE

## 2019-08-13 ENCOUNTER — Other Ambulatory Visit: Payer: Self-pay | Admitting: Physician Assistant

## 2019-08-13 DIAGNOSIS — I10 Essential (primary) hypertension: Secondary | ICD-10-CM

## 2019-08-13 DIAGNOSIS — M170 Bilateral primary osteoarthritis of knee: Secondary | ICD-10-CM

## 2019-09-02 IMAGING — CR LEFT ANKLE COMPLETE - 3+ VIEW
1 series · 3 of 3 positions shown · non-contrast
Comparison: None.

CLINICAL DATA: Pain without trauma.

EXAM:
LEFT ANKLE COMPLETE - 3+ VIEW

[Series 1: dg ankle complete left · 0.14mm/px · 3 of 3 slices shown]
[im 1/3]
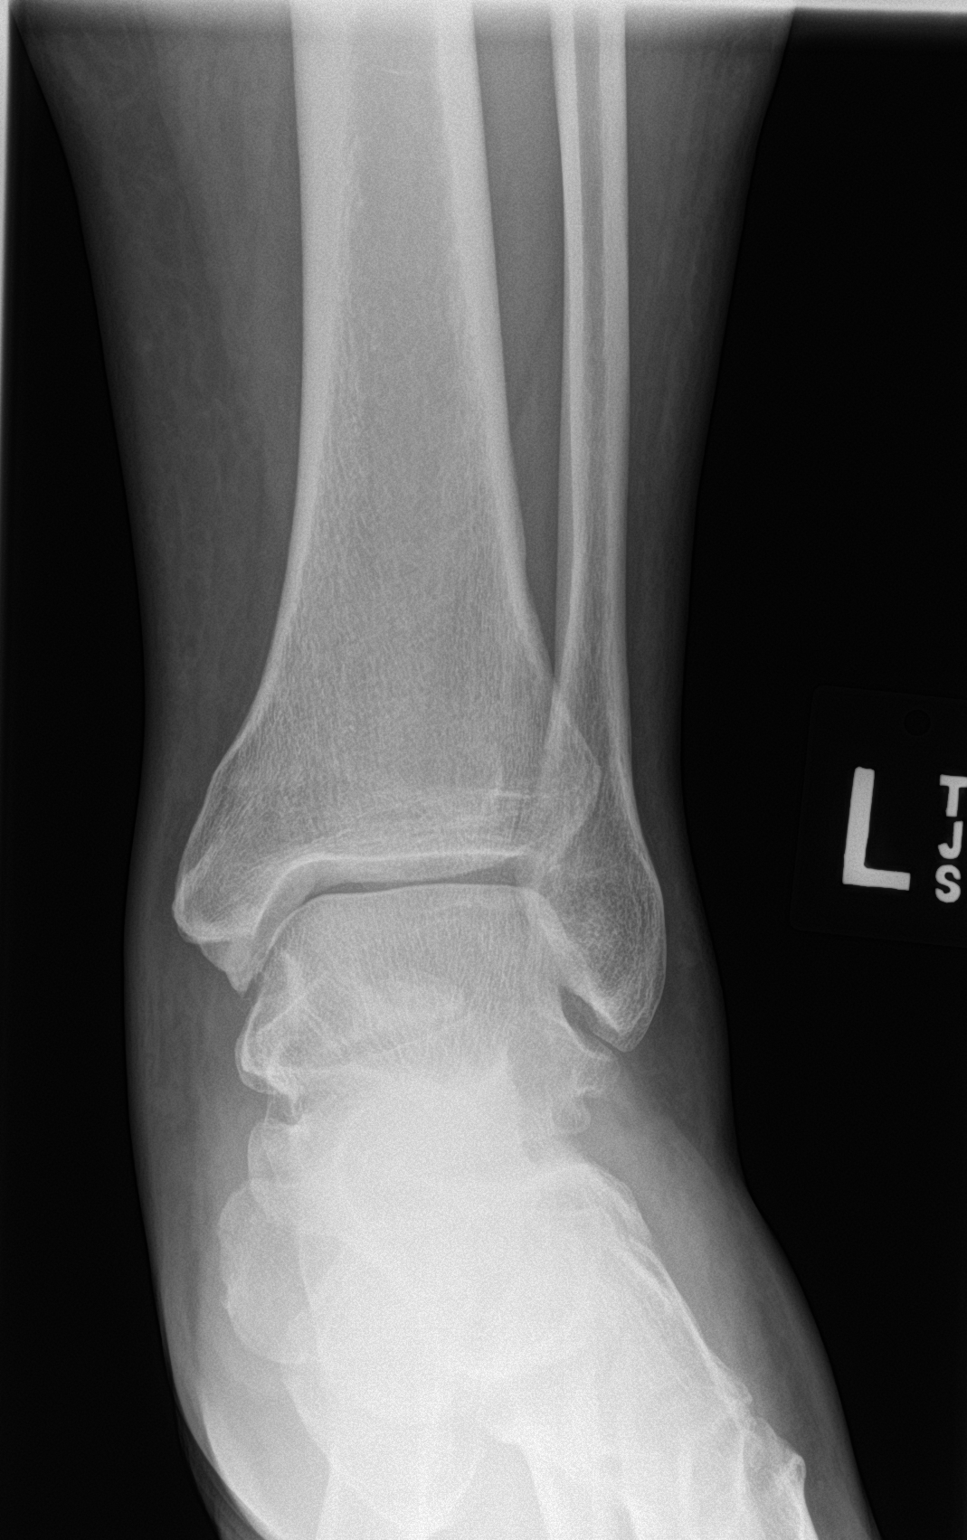
[im 2/3]
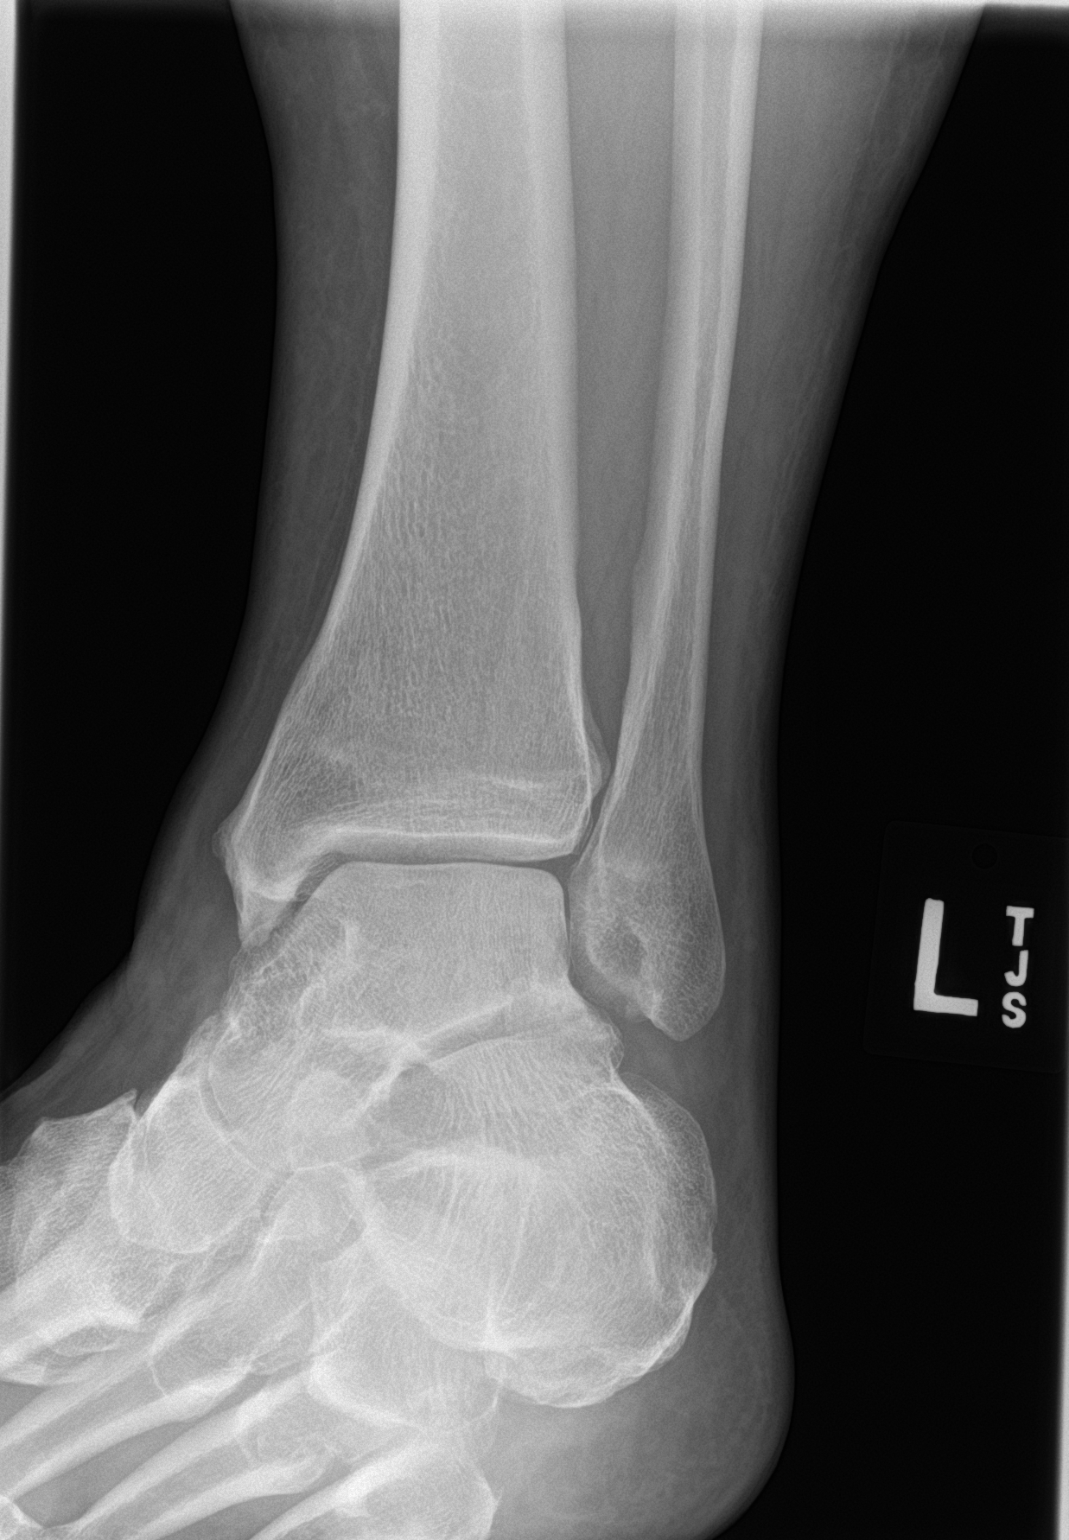
[im 3/3]
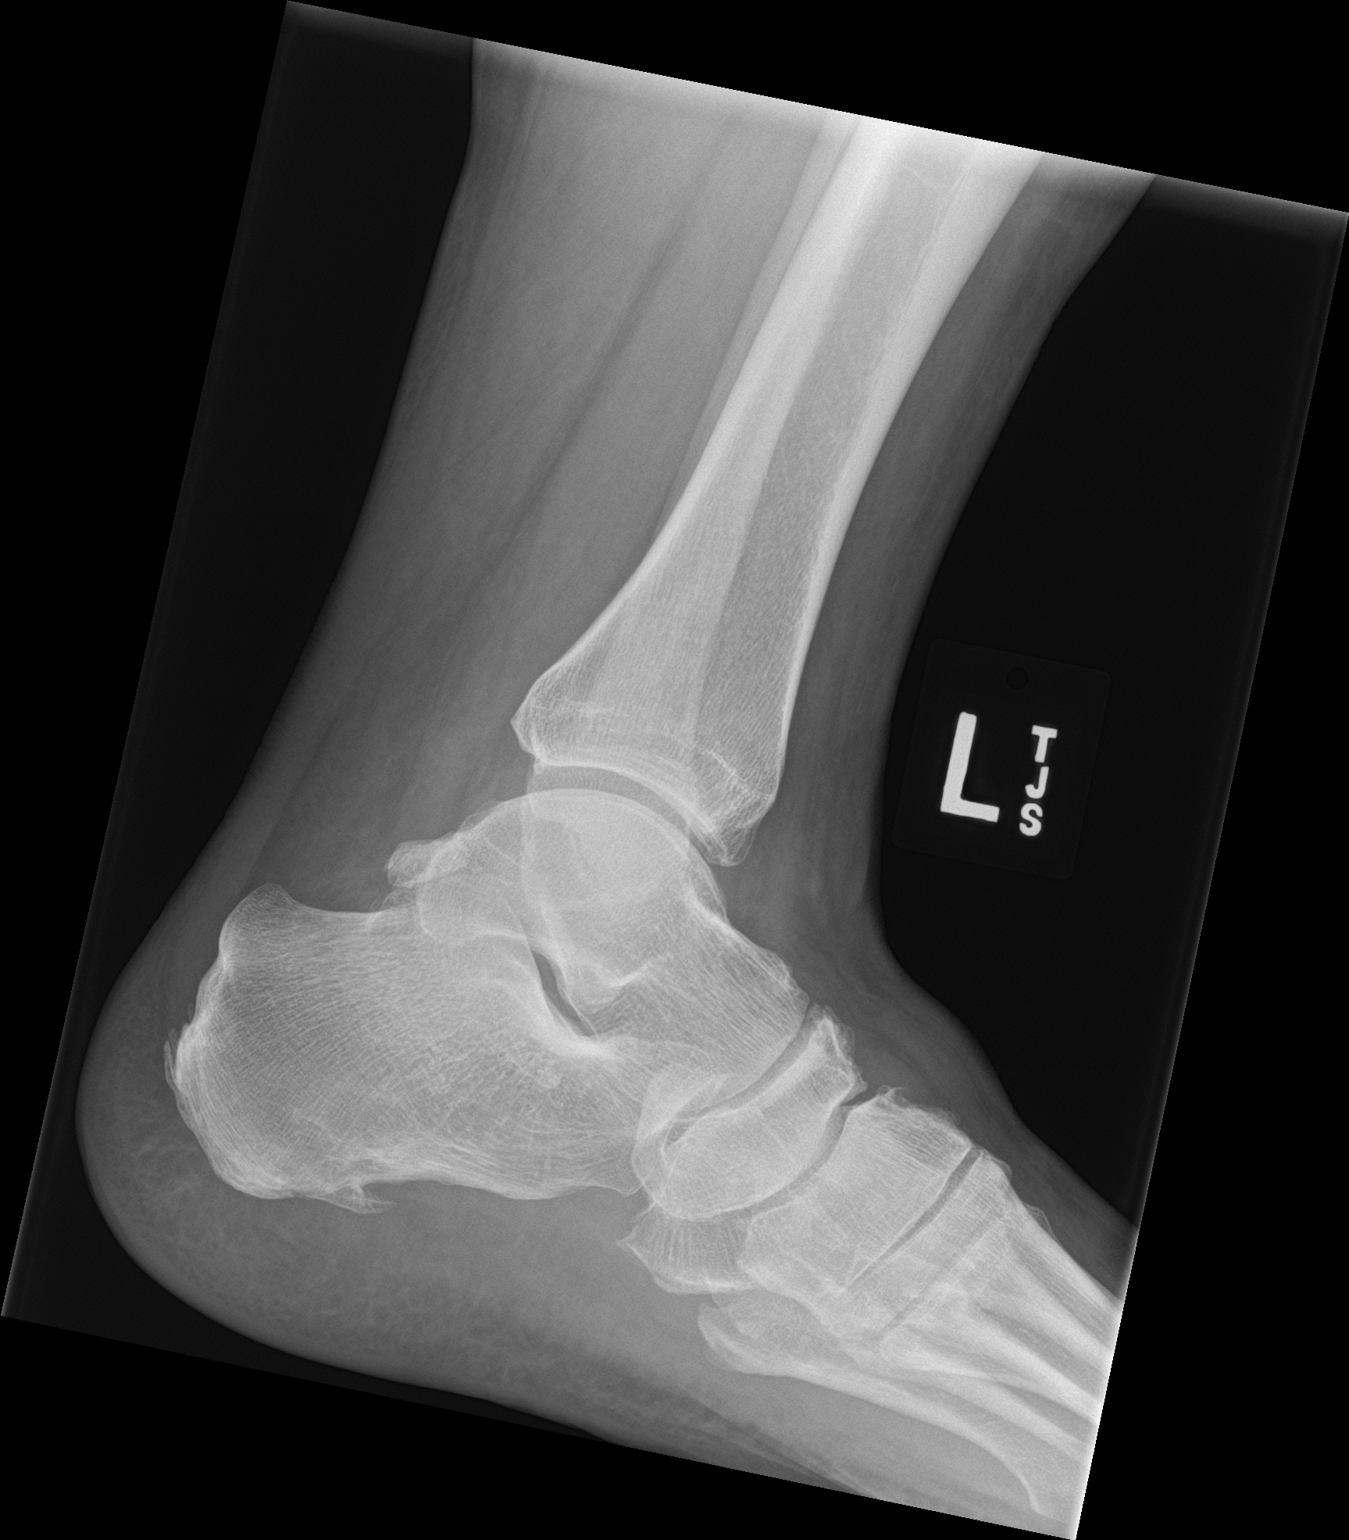

[3 of 3 positions shown; findings below may reference images not displayed]

FINDINGS: Enthesopathic changes at the Achilles insertion site. Small plantar
spur. Degenerative changes in the hindfoot. No acute fractures.
IMPRESSION: Enthesopathic changes at the Achilles insertion site on the
calcaneus. Small plantar spur. Degenerative changes in the hindfoot.

## 2019-10-21 ENCOUNTER — Other Ambulatory Visit: Payer: Self-pay | Admitting: Physician Assistant

## 2019-10-21 DIAGNOSIS — N411 Chronic prostatitis: Secondary | ICD-10-CM

## 2019-10-21 DIAGNOSIS — R350 Frequency of micturition: Secondary | ICD-10-CM

## 2019-10-21 NOTE — Telephone Encounter (Signed)
Requested Prescriptions  Pending Prescriptions Disp Refills  . tadalafil (CIALIS) 5 MG tablet [Pharmacy Med Name: TADALAFIL 5 MG TABLET] 90 tablet 0    Sig: TAKE ONE TABLET BY MOUTH DAILY     Urology: Erectile Dysfunction Agents Passed - 10/21/2019  4:57 PM      Passed - Last BP in normal range    BP Readings from Last 1 Encounters:  07/23/19 134/82         Passed - Valid encounter within last 12 months    Recent Outpatient Visits          3 months ago Urinary frequency   [place], [place]   5 months ago Acute right-sided low back pain with right-sided sciatica   [place], PA-C   6 months ago Acute midline low back pain without sciatica   [facility], Dionne Bucy, MD   8 months ago Annual physical exam   [place], [place]   9 months ago Arthralgia, unspecified joint   [place], [place], [place]

## 2019-11-14 ENCOUNTER — Other Ambulatory Visit: Payer: Self-pay | Admitting: Physician Assistant

## 2019-11-14 DIAGNOSIS — M170 Bilateral primary osteoarthritis of knee: Secondary | ICD-10-CM

## 2019-11-14 DIAGNOSIS — I10 Essential (primary) hypertension: Secondary | ICD-10-CM

## 2019-12-30 ENCOUNTER — Other Ambulatory Visit: Payer: Self-pay | Admitting: Physician Assistant

## 2019-12-30 DIAGNOSIS — F4323 Adjustment disorder with mixed anxiety and depressed mood: Secondary | ICD-10-CM

## 2019-12-31 ENCOUNTER — Telehealth: Payer: Self-pay

## 2019-12-31 NOTE — Telephone Encounter (Signed)
Copied from [place] [id number]. Topic: Clinical - COVID Pre-Screen >> Dec 31, 2019  8:44 AM Leward Quan A wrote: 1. To the best of your knowledge, have you been in close contact with anyone with a confirmed diagnosis of COVID 19?  no  If no - Proceed to next question; If yes - Schedule patient for a virtual visit  2. Have you had any one or more of the following: fever, chills, cough, shortness of breath or any flu-like symptoms?  no  If no - Proceed to next question; If yes - Schedule patient for a virtual visit  3. Have you been diagnosed with or have a previous diagnosis of COVID 19?  no  If no - Proceed to next question; If yes - Schedule patient for a virtual visit  4. I am going to go over a few other symptoms with you. Please let me know if you are experiencing any of the following: yes ( headache with abdominal pain)   Ear, nose or throat discomfort  A sore throat  Headache  Muscle pain  Diarrhea  Loss of taste or smell  If no - Continue with scheduling process; If yes - Document in scheduling notes   Thank you for answering these questions. Please know we will ask you these questions or similar questions when you arrive for your appointment and again it's how we are keeping everyone safe. Also, to keep you safe, please use the provided hand sanitizer when you enter the building. Genella Mech, we are asking everyone in the building to wear a mask because they help [place] prevent the spread of germs.   Do you have a mask of your own, if not, we are happy to provide one for you. The last thing I want to go over with you is the no visitor guidelines. This means no one can attend the appointment with you unless you need physical assistance. I understand this may be different from your past appointments and I know this may be difficult but please know if someone is driving you we are happy to call them for you once your appointment is over.

## 2020-01-01 ENCOUNTER — Encounter: Payer: Self-pay | Admitting: Physician Assistant

## 2020-01-01 ENCOUNTER — Other Ambulatory Visit: Payer: Self-pay

## 2020-01-01 ENCOUNTER — Ambulatory Visit (INDEPENDENT_AMBULATORY_CARE_PROVIDER_SITE_OTHER): Payer: 59 | Admitting: Physician Assistant

## 2020-01-01 VITALS — BP 150/83 | HR 64 | Temp 97.3°F | Resp 16 | Wt 281.4 lb

## 2020-01-01 DIAGNOSIS — K429 Umbilical hernia without obstruction or gangrene: Secondary | ICD-10-CM

## 2020-01-01 DIAGNOSIS — K5792 Diverticulitis of intestine, part unspecified, without perforation or abscess without bleeding: Secondary | ICD-10-CM

## 2020-01-01 DIAGNOSIS — Z6838 Body mass index (BMI) 38.0-38.9, adult: Secondary | ICD-10-CM

## 2020-01-01 DIAGNOSIS — E669 Obesity, unspecified: Secondary | ICD-10-CM | POA: Insufficient documentation

## 2020-01-01 MED ORDER — METRONIDAZOLE 500 MG PO TABS: 500.0000 mg | ORAL_TABLET | Freq: Three times a day (TID) | ORAL | 0 refills | Status: DC

## 2020-01-01 MED ORDER — CIPROFLOXACIN HCL 500 MG PO TABS: 500.0000 mg | ORAL_TABLET | Freq: Two times a day (BID) | ORAL | 0 refills | Status: DC

## 2020-01-01 NOTE — Patient Instructions (Signed)

## 2020-01-01 NOTE — Progress Notes (Signed)
I,Kyle Pollard,acting as a scribe for [employer], PA-C.,have documented all relevant documentation on the behalf of Kyle Daring, PA-C,as directed by  Kyle Daring, PA-C while in the presence of Kyle Pollard, [place].   Established patient visit   Patient: Kyle Pollard   DOB: 12/23/1960   59 y.o. Male  MRN: [id number] Visit Date: 01/01/2020  Today's healthcare provider: [name], PA-C   Chief Complaint  Patient presents with  . Abdominal Pain   Subjective    Abdominal Pain This is a new problem. The current episode started 1 to 4 weeks ago (Worsening last week). The onset quality is gradual. The problem occurs daily. The problem has been gradually worsening. The pain is located in the suprapubic region. The pain is at a severity of 3/10. The pain is moderate. The quality of the pain is aching. The abdominal pain radiates to the epigastric region. Pertinent negatives include no constipation, diarrhea, hematuria or vomiting. Associated symptoms comments: Patient feels is more like a hernia that he may have.. The pain is aggravated by eating. The pain is relieved by nothing. He has tried antacids (Prilosec and Tums) for the symptoms.  His legs are feeling weak.Reports that 3 weeks ago he got constipated and he took Miralax and it got him going. Reports that this week he has been going easy.   Patient Active Problem List   Diagnosis Date Noted  . Class 2 severe obesity due to excess calories with serious comorbidity and body mass index (BMI) of 38.0 to 38.9 in adult ([place]) 01/01/2020  . Primary osteoarthritis involving multiple joints 11/11/2015  . Clinical depression 09/14/2015  . BP (high blood pressure) 09/14/2015  . Triggering of digit 09/14/2015  . Chronic prostatitis 09/14/2015   Past Medical History:  Diagnosis Date  . Allergy   . Hypertension        Medications: Outpatient Medications Prior to Visit  Medication Sig  . FLUoxetine  (PROZAC) 20 MG tablet TAKE ONE TABLET BY MOUTH DAILY  . meloxicam (MOBIC) 15 MG tablet TAKE ONE TABLET BY MOUTH DAILY  . olmesartan (BENICAR) 40 MG tablet TAKE ONE TABLET BY MOUTH DAILY  . tadalafil (CIALIS) 5 MG tablet TAKE ONE TABLET BY MOUTH DAILY  . [DISCONTINUED] aspirin EC 81 MG tablet Take by mouth. Reported on 11/11/2015  . [DISCONTINUED] cyclobenzaprine (FLEXERIL) 10 MG tablet Take 1 tablet (10 mg total) by mouth 3 (three) times daily as needed for muscle spasms.  . [DISCONTINUED] gabapentin (NEURONTIN) 300 MG capsule Take 1 capsule (300 mg total) by mouth 3 (three) times daily.  . [DISCONTINUED] Multiple Vitamin (MULTI-VITAMINS) TABS Take by mouth.  . [DISCONTINUED] predniSONE (DELTASONE) 10 MG tablet Take 6 pills on day 1, 5 pills on day 2, 4 pills on day 3 and so on until complete.  . [DISCONTINUED] terbinafine (LAMISIL) 250 MG tablet    No facility-administered medications prior to visit.    Review of Systems  Constitutional: Positive for fatigue.  Respiratory: Negative for chest tightness and shortness of breath.   Cardiovascular: Negative for chest pain, palpitations and leg swelling.  Gastrointestinal: Positive for abdominal distention and abdominal pain. Negative for constipation, diarrhea and vomiting.  Genitourinary: Negative for hematuria.  Neurological: Positive for weakness ('Legs").    Last CBC Lab Results  Component Value Date   WBC 4.6 01/02/2019   HGB 15.3 01/02/2019   HCT 44.0 01/02/2019   MCV 85 01/02/2019   MCH 29.6 01/02/2019   RDW  12.1 01/02/2019   PLT 227 27/11/2374   Last metabolic panel Lab Results  Component Value Date   GLUCOSE 140 (H) 01/02/2019   NA 138 01/02/2019   K 4.1 01/02/2019   CL 104 01/02/2019   CO2 21 01/02/2019   BUN 18 01/02/2019   CREATININE 0.73 (L) 01/02/2019   GFRNONAA 101 01/02/2019   GFRAA 117 01/02/2019   CALCIUM 9.3 01/02/2019   PROT 6.6 01/02/2019   ALBUMIN 4.1 01/02/2019   LABGLOB 2.5 01/02/2019   AGRATIO 1.6  01/02/2019   BILITOT 0.4 01/02/2019   ALKPHOS 56 01/02/2019   AST 17 01/02/2019   ALT 23 01/02/2019      Objective    BP (!) 150/83 (BP Location: Left Arm, Patient Position: Sitting, Cuff Size: Large)   Pulse 64   Temp (!) 97.3 F (36.3 C) (Temporal)   Resp 16   Wt 281 lb 6.4 oz (127.6 kg)   BMI 38.16 kg/m  BP Readings from Last 3 Encounters:  01/01/20 (!) 150/83  07/23/19 134/82  04/25/19 135/84   Wt Readings from Last 3 Encounters:  01/01/20 281 lb 6.4 oz (127.6 kg)  07/23/19 287 lb (130.2 kg)  04/25/19 287 lb 12.8 oz (130.5 kg)      Physical Exam Vitals reviewed.  Constitutional:      Appearance: He is well-developed. He is obese.  Eyes:     General: No scleral icterus. Cardiovascular:     Rate and Rhythm: Normal rate and regular rhythm.     Heart sounds: Normal heart sounds. No murmur heard.   Pulmonary:     Effort: Pulmonary effort is normal.     Breath sounds: Normal breath sounds.  Abdominal:     General: Abdomen is protuberant. Bowel sounds are normal. There is no distension.     Palpations: Abdomen is soft. There is no mass.     Tenderness: There is generalized abdominal tenderness (generalized tenderless but most tender in the left lower quadrant) and tenderness in the left lower quadrant. There is rebound (LLQ). There is no guarding. Negative signs include Rovsing's sign and McBurney's sign.     Hernia: A hernia is present. Hernia is present in the umbilical area.     Comments: Diastasis recti  Neurological:     Mental Status: He is alert.      No results found for any visits on 01/01/20.  Assessment & Plan    1. Diverticulitis Will treat with Flagyl 500mg  and Cipro 500mg  as below. Will hold on CT of abdomen per patient request.Patient to call if symptoms persist and will order CT scan then. - metroNIDAZOLE (FLAGYL) 500 MG tablet; Take 1 tablet (500 mg total) by mouth 3 (three) times daily.  Dispense: 21 tablet; Refill: 0 - ciprofloxacin (CIPRO)  500 MG tablet; Take 1 tablet (500 mg total) by mouth 2 (two) times daily.  Dispense: 14 tablet; Refill: 0  2. Umbilical hernia without obstruction and without gangrene [place] ordere - US Abdomen Limited; Future  3. Class 2 severe obesity due to excess calories with serious comorbidity and body mass index (BMI) of 38.0 to 38.9 in adult [facility]) Counseled patient on healthy lifestyle modifications including dieting and exercise.   Return if symptoms worsen or fail to improve.      Reynolds Bowl, PA-C, have reviewed all documentation for this visit. The documentation on 01/01/20 for the exam, diagnosis, procedures, and orders are all accurate and complete.   Kyle Daring, PA-C  [facility] [phone] (phone) [phone] (fax)  [place]

## 2020-01-03 ENCOUNTER — Encounter: Payer: Self-pay | Admitting: Physician Assistant

## 2020-01-09 ENCOUNTER — Encounter: Payer: Self-pay | Admitting: Physician Assistant

## 2020-01-09 ENCOUNTER — Other Ambulatory Visit: Payer: Self-pay

## 2020-01-09 ENCOUNTER — Ambulatory Visit
Admission: RE | Admit: 2020-01-09 | Discharge: 2020-01-09 | Disposition: A | Discharge status: Home / Self Care | Payer: 59 | Source: Ambulatory Visit | Attending: Physician Assistant | Admitting: Physician Assistant

## 2020-01-09 ENCOUNTER — Telehealth: Payer: Self-pay

## 2020-01-09 DIAGNOSIS — K429 Umbilical hernia without obstruction or gangrene: Secondary | ICD-10-CM | POA: Diagnosis not present

## 2020-01-09 NOTE — Telephone Encounter (Signed)
The umbilical hernia does appear to contain bowel and fat. This would be at risk for strangulation of the bowel. If desired for consult of repair I can refer to general surgery.  Written by [name], PA-C on 01/09/2020 4:18 PM EDT Seen by patient Kyle Pollard on 01/09/2020 5:56 PM

## 2020-01-09 NOTE — Telephone Encounter (Signed)
-----   Message from [name], [place] sent at 01/09/2020  4:18 PM EDT ----- The umbilical hernia does appear to contain bowel and fat. This would be at risk for strangulation of the bowel. If desired for consult of repair I can refer to general surgery.

## 2020-01-16 ENCOUNTER — Ambulatory Visit (INDEPENDENT_AMBULATORY_CARE_PROVIDER_SITE_OTHER): Payer: 59 | Admitting: Surgery

## 2020-01-16 ENCOUNTER — Other Ambulatory Visit: Payer: Self-pay

## 2020-01-16 ENCOUNTER — Encounter: Payer: Self-pay | Admitting: Surgery

## 2020-01-16 VITALS — BP 148/94 | HR 71 | Temp 98.5°F | Resp 12 | Ht 72.0 in | Wt 286.0 lb

## 2020-01-16 DIAGNOSIS — Z6838 Body mass index (BMI) 38.0-38.9, adult: Secondary | ICD-10-CM

## 2020-01-16 DIAGNOSIS — Z1211 Encounter for screening for malignant neoplasm of colon: Secondary | ICD-10-CM | POA: Insufficient documentation

## 2020-01-16 DIAGNOSIS — M6208 Separation of muscle (nontraumatic), other site: Secondary | ICD-10-CM | POA: Diagnosis not present

## 2020-01-16 DIAGNOSIS — K429 Umbilical hernia without obstruction or gangrene: Secondary | ICD-10-CM | POA: Diagnosis not present

## 2020-01-16 NOTE — Patient Instructions (Addendum)
Referral sent to [facility]. Someone from their office will call within 7-10 days to schedule an appointment. If you do not hear from their office please call our office and let us know.  We will reach out to you in December 2021 to schedule the follow up visit for January. Please have the Colonoscopy done prior to seeing [place] in January.    Umbilical Hernia, Adult  A hernia is a bulge of tissue that pushes through an opening between muscles. An umbilical hernia happens in the abdomen, near the belly button (umbilicus). The hernia may contain tissues from the small intestine, large intestine, or fatty tissue covering the intestines (omentum). Umbilical hernias in adults tend to get worse over time, and they require surgical treatment. There are several types of umbilical hernias. You may have:  A hernia located just above or below the umbilicus (indirect hernia). This is the most common type of umbilical hernia in adults.  A hernia that forms through an opening formed by the umbilicus (direct hernia).  A hernia that comes and goes (reducible hernia). A reducible hernia may be visible only when you strain, lift something heavy, or cough. This type of hernia can be pushed back into the abdomen (reduced).  A hernia that traps abdominal tissue inside the hernia (incarcerated hernia). This type of hernia cannot be reduced.  A hernia that cuts off blood flow to the tissues inside the hernia (strangulated hernia). The tissues can start to die if this happens. This type of hernia requires emergency treatment. What are the causes? An umbilical hernia happens when tissue inside the abdomen presses on a weak area of the abdominal muscles. What increases the risk? You may have a greater risk of this condition if you:  Are obese.  Have had several pregnancies.  Have a buildup of fluid inside your abdomen (ascites).  Have had surgery that weakens the abdominal muscles. What are the signs  or symptoms? The main symptom of this condition is a painless bulge at or near the belly button. A reducible hernia may be visible only when you strain, lift something heavy, or cough. Other symptoms may include:  Dull pain.  A feeling of pressure. Symptoms of a strangulated hernia may include:  Pain that gets increasingly worse.  Nausea and vomiting.  Pain when pressing on the hernia.  Skin over the hernia becoming red or purple.  Constipation.  Blood in the stool. How is this diagnosed? This condition may be diagnosed based on:  A physical exam. You may be asked to cough or strain while standing. These actions increase the pressure inside your abdomen and force the hernia through the opening in your muscles. Your health care provider may try to reduce the hernia by pressing on it.  Your symptoms and medical history. How is this treated? Surgery is the only treatment for an umbilical hernia. Surgery for a strangulated hernia is done as soon as possible. If you have a small hernia that is not incarcerated, you may need to lose weight before having surgery. Follow these instructions at home:  Lose weight, if told by your health care provider.  Do not try to push the hernia back in.  Watch your hernia for any changes in color or size. Tell your health care provider if any changes occur.  You may need to avoid activities that increase pressure on your hernia.  Do not lift anything that is heavier than 10 lb (4.5 kg) until your health care provider says  that this is safe.  Take over-the-counter and prescription medicines only as told by your health care provider.  Keep all follow-up visits as told by your health care provider. This is important. Contact a health care provider if:  Your hernia gets larger.  Your hernia becomes painful. Get help right away if:  You develop sudden, severe pain near the area of your hernia.  You have pain as well as nausea or  vomiting.  You have pain and the skin over your hernia changes color.  You develop a fever. This information is not intended to replace advice given to you by your health care provider. Make sure you discuss any questions you have with your health care provider. Document Revised: 07/26/2017 Document Reviewed: 12/12/2016 Elsevier Patient Education  [place].

## 2020-01-16 NOTE — Progress Notes (Signed)
Patient ID: Kyle Pollard, male   DOB: 1960/09/23, 59 y.o.   MRN: [id number]  Chief Complaint: Umbilical hernia  History of Present Illness Kyle Pollard is a 59 y.o. male with a known umbilical hernia well over 4 years.  He reports the umbilical mass is increased in size, it remains painless.  He denies that it bothers him regardless of activity.  He had a recent episode of suprapubic/left lower quadrant discomfort which was presumptively treated as diverticulitis with Cipro and Flagyl.  He completed his course the abdominal pain resolved.  However during that presentation he was uncertain if the umbilical hernia played a role and brought it to the attention of his PCP.  An ultrasound was obtained and some measurements confirmed a small umbilical defect.  Presently he reports his abdominal pain is resolved.  He is having normal bowel activity.  He denies nausea, vomiting, fevers and chills. He reports it has been more than 10 years since his last colonoscopy.  Past Medical History Past Medical History:  Diagnosis Date  . Allergy   . Hypertension       Past Surgical History:  Procedure Laterality Date  . Broken Leg  1992  . VASECTOMY      Allergies  Allergen Reactions  . Penicillins Rash    Current Outpatient Medications  Medication Sig Dispense Refill  . FLUoxetine (PROZAC) 20 MG tablet TAKE ONE TABLET BY MOUTH DAILY 90 tablet 0  . meloxicam (MOBIC) 15 MG tablet TAKE ONE TABLET BY MOUTH DAILY 90 tablet 0  . olmesartan (BENICAR) 40 MG tablet TAKE ONE TABLET BY MOUTH DAILY 90 tablet 0  . tadalafil (CIALIS) 5 MG tablet TAKE ONE TABLET BY MOUTH DAILY 90 tablet 0   No current facility-administered medications for this visit.    Family History Family History  Problem Relation Age of Onset  . Dementia Mother   . Prostate cancer Neg Hx   . Bladder Cancer Neg Hx   . Kidney cancer Neg Hx       Social History Social History   Tobacco Use  . Smoking status: Never Smoker  .  Smokeless tobacco: Current User  . Tobacco comment: Tobacco-dips  Vaping Use  . Vaping Use: Never used  Substance Use Topics  . Alcohol use: Yes  . Drug use: No        Review of Systems  Constitutional: Negative.   HENT: Positive for hearing loss.   Eyes: Negative.   Respiratory: Negative.   Cardiovascular: Negative.   Gastrointestinal: Positive for constipation and heartburn.  Genitourinary: Positive for frequency.  Skin: Negative.   Neurological: Negative.   Psychiatric/Behavioral: Negative.       Physical Exam Blood pressure (!) 148/94, pulse 71, temperature 98.5 F (36.9 C), temperature source Oral, resp. rate 12, height 6' (1.829 m), weight 286 lb (129.7 kg), SpO2 95 %. Last Weight  Most recent update: 01/16/2020 10:08 AM   Weight  129.7 kg (286 lb)            CONSTITUTIONAL: Well developed, obese and well nourished, appropriately responsive and aware without distress.   EYES: Sclera non-icteric.   EARS, NOSE, MOUTH AND THROAT: Mask worn.    Hearing is intact to voice.  NECK: Trachea is midline, and there is no jugular venous distension.  LYMPH NODES:  Lymph nodes in the neck are not enlarged. RESPIRATORY:  Lungs are clear, and breath sounds are equal bilaterally. Normal respiratory effort without pathologic use of accessory muscles. CARDIOVASCULAR: Heart is  regular in rate and rhythm. GI: The abdomen is soft, nontender, and nondistended. There were no palpable masses.  He has a diastases recti of 3 cm width. He has a readily reducible umbilical hernia with a defect the size of the tip of my index finger which is about 1.2 cm.  I did not appreciate hepatosplenomegaly. There were normal bowel sounds. MUSCULOSKELETAL:  Symmetrical muscle tone appreciated in all four extremities.    SKIN: Skin turgor is normal. No pathologic skin lesions appreciated.  NEUROLOGIC:  Motor and sensation appear grossly normal.  Cranial nerves are grossly without defect. PSYCH:  Alert and  oriented to person, place and time. Affect is appropriate for situation.  Data Reviewed I have personally reviewed what is currently available of the patient's imaging, recent labs and medical records.   Labs:  CBC Latest Ref Rng & Units 01/02/2019 12/29/2017 12/22/2016  WBC 3.4 - 10.8 x10E3/uL 4.6 5.2 4.8  Hemoglobin 13.0 - 17.7 g/dL 15.3 15.6 15.4  Hematocrit 37.5 - 51.0 % 44.0 46.3 43.3  Platelets 150 - 450 x10E3/uL 227 240 220   CMP Latest Ref Rng & Units 01/02/2019 12/29/2017 12/22/2016  Glucose 65 - 99 mg/dL 140(H) 98 102(H)  BUN 8 - 27 mg/dL 18 18 17   Creatinine 0.76 - 1.27 mg/dL 0.73(L) 0.82 0.88  Sodium 134 - 144 mmol/L 138 137 140  Potassium 3.5 - 5.2 mmol/L 4.1 4.3 4.3  Chloride 96 - 106 mmol/L 104 100 103  CO2 20 - 29 mmol/L 21 23 20   Calcium 8.6 - 10.2 mg/dL 9.3 9.5 9.2  Total Protein 6.0 - 8.5 g/dL 6.6 7.1 6.9  Total Bilirubin 0.0 - 1.2 mg/dL 0.4 0.5 0.4  Alkaline Phos 39 - 117 IU/L 56 57 56  AST 0 - 40 IU/L 17 20 19   ALT 0 - 44 IU/L 23 21 25       Imaging: I personally reviewed the ultrasound images, and I believe much of the measurements involved the hernia sac rather than the fascial defect.  Fascial defect is readily appreciable on physical examination. Radiology review:  CLINICAL DATA:  Palpable fullness umbilicus region.  EXAM: ULTRASOUND PERIUMBILICAL ABDOMINAL WALL  COMPARISON:  None.  FINDINGS: Longitudinal and transverse images were obtained at the level of palpable fullness at the periumbilical level within without Valsalva maneuver. There is an apparent hernia at the site of palpable fullness measuring 1.9 x 1.2 x 2.9 cm. There is apparent peristalsis bowel in this hernia as well as fat. No abnormal fluid or inflammation in this area. No focal mass in this area.  IMPRESSION: Apparent periumbilical hernia at the site of palpable fullness. There appears to be bowel and fat in this area of herniation. This finding may warrant correlation with CT  abdomen, ideally with oral contrast, to further assess with respect to amount of bowel in this area as well as to more precisely delineate the hernia size.   Electronically Signed   By: Lowella Grip III M.D.   On: 01/09/2020 15:58  Within last 24 hrs: No results found.  Assessment    Umbilical hernia, asymptomatic.  Fascial defect size 1.2 cm. Patient Active Problem List   Diagnosis Date Noted  . Diastasis recti 01/16/2020  . Umbilical hernia without obstruction and without gangrene 01/16/2020  . Encounter for screening colonoscopy 01/16/2020  . Class 2 severe obesity due to excess calories with serious comorbidity and body mass index (BMI) of 38.0 to 38.9 in adult ([place]) 01/01/2020  . Primary osteoarthritis involving  multiple joints 11/11/2015  . Clinical depression 09/14/2015  . BP (high blood pressure) 09/14/2015  . Triggering of digit 09/14/2015  . Chronic prostatitis 09/14/2015    Plan   We discussed the various approaches to repairing the small defect, beginning with primary suture closure, to open placement of preperitoneal mesh, 2 laparoscopic/robotic assisted primary closure with IPOM mesh placement.  We discussed the role of his diastases recti. I discussed possibility of incarceration, strangulation, enlargement in size over time, and the risk of emergency surgery in the face of strangulation.  Also discussed the risk of surgery including recurrence which can be up to 30% in the case of complex hernias, use of prosthetic materials (mesh) and the increased risk of infxn, post-op infxn and the possible need for re-operation and removal of mesh if used, possibility of post-op SBO or ileus, and the risks of general anesthetic including MI, CVA, sudden death or even reaction to anesthetic medications. The patient and family understands the risks, any and all questions were answered to the patient's satisfaction.  I encouraged him to pursue weight loss and fitness, get a  screening colonoscopy completed and follow-up with me in 6 months.  I believe he is a responsible adult and has no significant risk factors for developing an emergent incarceration, we discussed this possibility.  If you are having significant symptoms we could also address repair more urgently. Face-to-face time spent with the patient and accompanying care providers(if present) was 30 minutes, with more than 50% of the time spent counseling, educating, and coordinating care of the patient.      Ronny Bacon M.D., FACS 01/16/2020, 10:46 AM

## 2020-01-21 ENCOUNTER — Other Ambulatory Visit: Payer: Self-pay | Admitting: Physician Assistant

## 2020-01-21 DIAGNOSIS — R350 Frequency of micturition: Secondary | ICD-10-CM

## 2020-01-21 DIAGNOSIS — N411 Chronic prostatitis: Secondary | ICD-10-CM

## 2020-01-21 NOTE — Telephone Encounter (Signed)
Requested medications are due for refill today? Yes  Requested medications are on active medication list?  Yes  Last Refill:   10/21/2019  # 90 with no refills  Future visit scheduled?  Yes in 3 weeks  Notes to Clinic:  Medication failed RX refill protocol due to abnormal BP.  Last two BP's:  01/16/2020 148/94     01/01/2020  150/83    Please review.

## 2020-01-27 ENCOUNTER — Other Ambulatory Visit: Payer: Self-pay

## 2020-01-27 ENCOUNTER — Telehealth (INDEPENDENT_AMBULATORY_CARE_PROVIDER_SITE_OTHER): Payer: Self-pay | Admitting: Gastroenterology

## 2020-01-27 DIAGNOSIS — Z1211 Encounter for screening for malignant neoplasm of colon: Secondary | ICD-10-CM

## 2020-01-27 NOTE — Progress Notes (Signed)
Gastroenterology Pre-Procedure Review  Request Date: mONDAY 03/09/20 Requesting Physician: Dr. Vicente Males  PATIENT REVIEW QUESTIONS: The patient responded to the following health history questions as indicated:    1. Are you having any GI issues? no 2. Do you have a personal history of Polyps? no 3. Do you have a family history of Colon Cancer or Polyps? no 4. Diabetes Mellitus? no 5. Joint replacements in the past 12 months?no 6. Major health problems in the past 3 months?yes (possibly Diverticulitis 12/2018 pt states CT Scan was not done to confirm) 7. Any artificial heart valves, MVP, or defibrillator?no    MEDICATIONS & ALLERGIES:    Patient reports the following regarding taking any anticoagulation/antiplatelet therapy:   Plavix, Coumadin, Eliquis, Xarelto, Lovenox, Pradaxa, Brilinta, or Effient? no Aspirin? no  Patient confirms/reports the following medications:  Current Outpatient Medications  Medication Sig Dispense Refill  . FLUoxetine (PROZAC) 20 MG tablet TAKE ONE TABLET BY MOUTH DAILY 90 tablet 0  . meloxicam (MOBIC) 15 MG tablet TAKE ONE TABLET BY MOUTH DAILY 90 tablet 0  . olmesartan (BENICAR) 40 MG tablet TAKE ONE TABLET BY MOUTH DAILY 90 tablet 0  . tadalafil (CIALIS) 5 MG tablet TAKE ONE TABLET BY MOUTH DAILY 90 tablet 0   No current facility-administered medications for this visit.    Patient confirms/reports the following allergies:  Allergies  Allergen Reactions  . Penicillins Rash    No orders of the defined types were placed in this encounter.   AUTHORIZATION INFORMATION Primary Insurance: 1D#: Group #:  Secondary Insurance: 1D#: Group #:  SCHEDULE INFORMATION: Date: Monday 03/09/20 Time: Location:[facility]

## 2020-02-11 NOTE — Progress Notes (Signed)
Complete physical exam   Patient: Kyle Pollard   DOB: August 29, 1960   59 y.o. Male  MRN: [id number] Visit Date: 02/13/2020  Today's healthcare provider: [name], PA-C   Chief Complaint  Patient presents with  . Annual Exam   Subjective    Kyle Pollard is a 59 y.o. male who presents today for a complete physical exam.  He reports consuming a low calorie diet. The patient has a physically strenuous job, but has no regular exercise apart from work. Mostly walking He generally feels well. He reports sleeping well. He does not have additional problems to discuss today.  HPI   Past Medical History:  Diagnosis Date  . Allergy   . Hypertension    Past Surgical History:  Procedure Laterality Date  . Broken Leg  1992  . VASECTOMY     Social History   Socioeconomic History  . Marital status: Married    Spouse name: Not on file  . Number of children: Not on file  . Years of education: Not on file  . Highest education level: Not on file  Occupational History  . Not on file  Tobacco Use  . Smoking status: Never Smoker  . Smokeless tobacco: Current User  . Tobacco comment: Tobacco-dips  Vaping Use  . Vaping Use: Never used  Substance and Sexual Activity  . Alcohol use: Yes  . Drug use: No  . Sexual activity: Yes  Other Topics Concern  . Not on file  Social History Narrative  . Not on file   Social Determinants of Health   Financial Resource Strain:   . Difficulty of Paying Living Expenses: Not on file  Food Insecurity:   . Worried About Charity fundraiser in the Last Year: Not on file  . Ran Out of Food in the Last Year: Not on file  Transportation Needs:   . Lack of Transportation (Medical): Not on file  . Lack of Transportation (Non-Medical): Not on file  Physical Activity:   . Days of Exercise per Week: Not on file  . Minutes of Exercise per Session: Not on file  Stress:   . Feeling of Stress : Not on file  Social Connections:   . Frequency of  Communication with Friends and Family: Not on file  . Frequency of Social Gatherings with Friends and Family: Not on file  . Attends Religious Services: Not on file  . Active Member of Clubs or Organizations: Not on file  . Attends Archivist Meetings: Not on file  . Marital Status: Not on file  Intimate Partner Violence:   . Fear of Current or Ex-Partner: Not on file  . Emotionally Abused: Not on file  . Physically Abused: Not on file  . Sexually Abused: Not on file   Family Status  Relation Name Status  . Mother  Deceased  . Father  Deceased  . Neg Hx  (Not Specified)   Family History  Problem Relation Age of Onset  . Dementia Mother   . Prostate cancer Neg Hx   . Bladder Cancer Neg Hx   . Kidney cancer Neg Hx    Allergies  Allergen Reactions  . Penicillins Rash    Patient Care Team: Rubye Beach as PCP - General (Family Medicine)   Medications: Outpatient Medications Prior to Visit  Medication Sig  . FLUoxetine (PROZAC) 20 MG tablet TAKE ONE TABLET BY MOUTH DAILY  . meloxicam (MOBIC) 15 MG tablet TAKE  ONE TABLET BY MOUTH DAILY  . olmesartan (BENICAR) 40 MG tablet TAKE ONE TABLET BY MOUTH DAILY  . tadalafil (CIALIS) 5 MG tablet TAKE ONE TABLET BY MOUTH DAILY   No facility-administered medications prior to visit.    Review of Systems  Constitutional: Negative.   HENT: Negative.   Eyes: Negative.   Respiratory: Negative.   Cardiovascular: Negative.   Gastrointestinal: Negative.   Endocrine: Negative.   Genitourinary: Negative.   Musculoskeletal: Positive for arthralgias.  Skin: Negative.   Allergic/Immunologic: Negative.   Neurological: Negative.   Hematological: Negative.   Psychiatric/Behavioral: Negative.     Last CBC Lab Results  Component Value Date   WBC 7.3 02/13/2020   HGB 15.0 02/13/2020   HCT 43.8 02/13/2020   MCV 87 02/13/2020   MCH 29.7 02/13/2020   RDW 13.0 02/13/2020   PLT 240 03/50/0938   Last metabolic  panel Lab Results  Component Value Date   GLUCOSE 93 02/13/2020   NA 142 02/13/2020   K 4.4 02/13/2020   CL 103 02/13/2020   CO2 22 02/13/2020   BUN 19 02/13/2020   CREATININE 0.94 02/13/2020   GFRNONAA 87 02/13/2020   GFRAA 101 02/13/2020   CALCIUM 9.2 02/13/2020   PROT 7.3 02/13/2020   ALBUMIN 4.6 02/13/2020   LABGLOB 2.7 02/13/2020   AGRATIO 1.7 02/13/2020   BILITOT 0.5 02/13/2020   ALKPHOS 66 02/13/2020   AST 27 02/13/2020   ALT 35 02/13/2020      Objective    BP (!) 155/81 (BP Location: Left Arm, Patient Position: Sitting, Cuff Size: Large)   Pulse 80   Temp 98.3 F (36.8 C) (Oral)   Resp 16   Ht 6' (1.829 m)   Wt 269 lb 9.6 oz (122.3 kg)   BMI 36.56 kg/m  BP Readings from Last 3 Encounters:  02/13/20 (!) 155/81  01/16/20 (!) 148/94  01/01/20 (!) 150/83   Wt Readings from Last 3 Encounters:  02/13/20 269 lb 9.6 oz (122.3 kg)  01/16/20 286 lb (129.7 kg)  01/01/20 281 lb 6.4 oz (127.6 kg)      Physical Exam Vitals reviewed.  Constitutional:      General: He is not in acute distress.    Appearance: Normal appearance. He is well-developed. He is obese. He is not ill-appearing.  HENT:     Head: Normocephalic and atraumatic.     Right Ear: Tympanic membrane, ear canal and external ear normal.     Left Ear: Tympanic membrane, ear canal and external ear normal.     Nose: Nose normal.     Mouth/Throat:     Mouth: Mucous membranes are moist.     Pharynx: Oropharynx is clear.  Eyes:     General:        Right eye: No discharge.        Left eye: No discharge.     Extraocular Movements: Extraocular movements intact.     Conjunctiva/sclera: Conjunctivae normal.     Pupils: Pupils are equal, round, and reactive to light.  Neck:     Thyroid: No thyromegaly.     Vascular: No carotid bruit.     Trachea: No tracheal deviation.  Cardiovascular:     Rate and Rhythm: Normal rate and regular rhythm.     Pulses: Normal pulses.     Heart sounds: Normal heart  sounds. No murmur heard.   Pulmonary:     Effort: Pulmonary effort is normal. No respiratory distress.     Breath  sounds: Normal breath sounds. No wheezing or rales.  Chest:     Chest wall: No tenderness.  Abdominal:     General: Abdomen is flat. Bowel sounds are normal. There is no distension.     Palpations: Abdomen is soft. There is no mass.     Tenderness: There is no abdominal tenderness. There is no guarding or rebound.  Musculoskeletal:        General: No tenderness. Normal range of motion.     Cervical back: Normal range of motion and neck supple.     Right lower leg: No edema.     Left lower leg: No edema.  Lymphadenopathy:     Cervical: No cervical adenopathy.  Skin:    General: Skin is warm and dry.     Capillary Refill: Capillary refill takes less than 2 seconds.     Findings: No erythema or rash.  Neurological:     General: No focal deficit present.     Mental Status: He is alert and oriented to person, place, and time. Mental status is at baseline.     Cranial Nerves: No cranial nerve deficit.     Sensory: No sensory deficit.     Motor: No weakness or abnormal muscle tone.     Coordination: Coordination normal.     Gait: Gait normal.     Deep Tendon Reflexes: Reflexes are normal and symmetric. Reflexes normal.  Psychiatric:        Mood and Affect: Mood normal.        Behavior: Behavior normal.        Thought Content: Thought content normal.        Judgment: Judgment normal.       Last depression screening scores PHQ 2/9 Scores 02/13/2020 02/06/2019 02/06/2019  PHQ - 2 Score 0 0 0  PHQ- 9 Score 0 0 -   Last fall risk screening Fall Risk  01/16/2020  Falls in the past year? 0   Last Audit-C alcohol use screening Alcohol Use Disorder Test (AUDIT) 02/06/2019  1. How often do you have a drink containing alcohol? 4  2. How many drinks containing alcohol do you have on a typical day when you are drinking? 0  3. How often do you have six or more drinks on one  occasion? 1  AUDIT-C Score 5  Alcohol Brief Interventions/Follow-up AUDIT Score <7 follow-up not indicated   A score of 3 or more in women, and 4 or more in men indicates increased risk for alcohol abuse, EXCEPT if all of the points are from question 1   No results found for any visits on 02/13/20.  Assessment & Plan    Routine Health Maintenance and Physical Exam  Exercise Activities and Dietary recommendations Goals   None     Immunization History  Administered Date(s) Administered  . Tdap 02/06/2019    Health Maintenance  Topic Date Due  . COVID-19 Vaccine (1) Never done  . COLONOSCOPY  Never done  . INFLUENZA VACCINE  01/26/2020  . TETANUS/TDAP  02/05/2029  . Hepatitis C Screening  Completed  . HIV Screening  Completed    Discussed health benefits of physical activity, and encouraged him to engage in regular exercise appropriate for his age and condition.  1. Annual physical exam Normal physical exam today. Will check labs as below and f/u pending lab results. If labs are stable and WNL he will not need to have these rechecked for one year at his next annual physical  exam. He is to call the office in the meantime if he has any acute issue, questions or concerns. - CBC with Differential/Platelet - Comprehensive metabolic panel - Hemoglobin A1c - TSH - Lipid panel  2. Essential hypertension Elevated. Continue Olmesartan 40mg . Will check labs as below and f/u pending results. Will monitor BP.  - CBC with Differential/Platelet - Comprehensive metabolic panel - Hemoglobin A1c - Lipid panel  3. Chronic prostatitis Stable. Continue Tamsulosin. Will check labs as below and f/u pending results. - CBC with Differential/Platelet - Comprehensive metabolic panel - PSA Total (Reflex To Free)  4. Class 2 severe obesity due to excess calories with serious comorbidity and body mass index (BMI) of 36.0 to 36.9 in adult [facility]) Counseled patient on healthy lifestyle  modifications including dieting and exercise.  Doing well and has been losing weight with healthy lifestyle modifications.  - CBC with Differential/Platelet - Comprehensive metabolic panel - Hemoglobin A1c - Lipid panel  5. Prostate cancer screening Will check labs as below and f/u pending results. - PSA Total (Reflex To Free)  6. Colon cancer screening Due for colon cancer screening. Has colonoscopy scheduled with Dr. Vicente Males on 03/09/20.   No follow-ups on file.     Reynolds Bowl, PA-C, have reviewed all documentation for this visit. The documentation on 02/18/20 for the exam, diagnosis, procedures, and orders are all accurate and complete.   Rubye Beach  [facility] [phone] (phone) [phone] (fax)  [place]

## 2020-02-12 ENCOUNTER — Other Ambulatory Visit: Payer: Self-pay | Admitting: Physician Assistant

## 2020-02-12 DIAGNOSIS — M170 Bilateral primary osteoarthritis of knee: Secondary | ICD-10-CM

## 2020-02-12 DIAGNOSIS — I10 Essential (primary) hypertension: Secondary | ICD-10-CM

## 2020-02-13 ENCOUNTER — Encounter: Payer: Self-pay | Admitting: Physician Assistant

## 2020-02-13 ENCOUNTER — Other Ambulatory Visit: Payer: Self-pay

## 2020-02-13 ENCOUNTER — Ambulatory Visit (INDEPENDENT_AMBULATORY_CARE_PROVIDER_SITE_OTHER): Payer: 59 | Admitting: Physician Assistant

## 2020-02-13 VITALS — BP 155/81 | HR 80 | Temp 98.3°F | Resp 16 | Ht 72.0 in | Wt 269.6 lb

## 2020-02-13 DIAGNOSIS — N411 Chronic prostatitis: Secondary | ICD-10-CM | POA: Diagnosis not present

## 2020-02-13 DIAGNOSIS — Z0000 Encounter for general adult medical examination without abnormal findings: Secondary | ICD-10-CM

## 2020-02-13 DIAGNOSIS — Z1211 Encounter for screening for malignant neoplasm of colon: Secondary | ICD-10-CM

## 2020-02-13 DIAGNOSIS — Z6836 Body mass index (BMI) 36.0-36.9, adult: Secondary | ICD-10-CM

## 2020-02-13 DIAGNOSIS — I10 Essential (primary) hypertension: Secondary | ICD-10-CM

## 2020-02-13 DIAGNOSIS — Z125 Encounter for screening for malignant neoplasm of prostate: Secondary | ICD-10-CM

## 2020-02-13 NOTE — Patient Instructions (Signed)

## 2020-02-14 LAB — COMPREHENSIVE METABOLIC PANEL
ALT: 35 IU/L (ref 0–44)
AST: 27 IU/L (ref 0–40)
Albumin/Globulin Ratio: 1.7 (ref 1.2–2.2)
Albumin: 4.6 g/dL (ref 3.8–4.8)
Alkaline Phosphatase: 66 IU/L (ref 48–121)
BUN/Creatinine Ratio: 20 (ref 10–24)
BUN: 19 mg/dL (ref 8–27)
Bilirubin Total: 0.5 mg/dL (ref 0.0–1.2)
CO2: 22 mmol/L (ref 20–29)
Calcium: 9.2 mg/dL (ref 8.6–10.2)
Chloride: 103 mmol/L (ref 96–106)
Creatinine, Ser: 0.94 mg/dL (ref 0.76–1.27)
GFR calc Af Amer: 101 mL/min/{1.73_m2} (ref >59)
GFR calc non Af Amer: 87 mL/min/{1.73_m2} (ref >59)
Globulin, Total: 2.7 g/dL (ref 1.5–4.5)
Glucose: 93 mg/dL (ref 65–99)
Potassium: 4.4 mmol/L (ref 3.5–5.2)
Sodium: 142 mmol/L (ref 134–144)
Total Protein: 7.3 g/dL (ref 6.0–8.5)

## 2020-02-14 LAB — CBC WITH DIFFERENTIAL/PLATELET
Basophils Absolute: 0.1 10*3/uL (ref 0.0–0.2)
Basos: 1 %
EOS (ABSOLUTE): 0.2 10*3/uL (ref 0.0–0.4)
Eos: 3 %
Hematocrit: 43.8 % (ref 37.5–51.0)
Hemoglobin: 15 g/dL (ref 13.0–17.7)
Immature Grans (Abs): 0 10*3/uL (ref 0.0–0.1)
Immature Granulocytes: 0 %
Lymphocytes Absolute: 1.5 10*3/uL (ref 0.7–3.1)
Lymphs: 21 %
MCH: 29.7 pg (ref 26.6–33.0)
MCHC: 34.2 g/dL (ref 31.5–35.7)
MCV: 87 fL (ref 79–97)
Monocytes Absolute: 0.7 10*3/uL (ref 0.1–0.9)
Monocytes: 9 %
Neutrophils Absolute: 4.9 10*3/uL (ref 1.4–7.0)
Neutrophils: 66 %
Platelets: 240 10*3/uL (ref 150–450)
RBC: 5.05 x10E6/uL (ref 4.14–5.80)
RDW: 13 % (ref 11.6–15.4)
WBC: 7.3 10*3/uL (ref 3.4–10.8)

## 2020-02-14 LAB — LIPID PANEL
Chol/HDL Ratio: 3.8 ratio (ref 0.0–5.0)
Cholesterol, Total: 176 mg/dL (ref 100–199)
HDL: 46 mg/dL (ref >39)
LDL Chol Calc (NIH): 115 mg/dL — ABNORMAL HIGH (ref 0–99)
Triglycerides: 81 mg/dL (ref 0–149)
VLDL Cholesterol Cal: 15 mg/dL (ref 5–40)

## 2020-02-14 LAB — HEMOGLOBIN A1C
Est. average glucose Bld gHb Est-mCnc: 126 mg/dL
Hgb A1c MFr Bld: 6 % — ABNORMAL HIGH (ref 4.8–5.6)

## 2020-02-14 LAB — TSH: TSH: 1.61 u[IU]/mL (ref 0.450–4.500)

## 2020-02-14 LAB — PSA TOTAL (REFLEX TO FREE): Prostate Specific Ag, Serum: 1.1 ng/mL (ref 0.0–4.0)

## 2020-02-18 ENCOUNTER — Encounter: Payer: Self-pay | Admitting: Physician Assistant

## 2020-03-05 ENCOUNTER — Other Ambulatory Visit: Admission: RE | Admit: 2020-03-05 | Payer: 59 | Source: Ambulatory Visit

## 2020-03-06 ENCOUNTER — Other Ambulatory Visit: Payer: Self-pay

## 2020-03-06 ENCOUNTER — Other Ambulatory Visit
Admission: RE | Admit: 2020-03-06 | Discharge: 2020-03-06 | Disposition: A | Discharge status: Home / Self Care | Payer: 59 | Source: Ambulatory Visit | Attending: Gastroenterology | Admitting: Gastroenterology

## 2020-03-06 DIAGNOSIS — Z01812 Encounter for preprocedural laboratory examination: Secondary | ICD-10-CM | POA: Insufficient documentation

## 2020-03-06 DIAGNOSIS — Z20822 Contact with and (suspected) exposure to covid-19: Secondary | ICD-10-CM | POA: Diagnosis not present

## 2020-03-07 LAB — SARS CORONAVIRUS 2 (TAT 6-24 HRS): SARS Coronavirus 2: NEGATIVE

## 2020-03-09 ENCOUNTER — Ambulatory Visit
Admission: RE | Admit: 2020-03-09 | Discharge: 2020-03-09 | Disposition: A | Discharge status: Home / Self Care | Payer: 59 | Attending: Gastroenterology | Admitting: Gastroenterology

## 2020-03-09 ENCOUNTER — Encounter
Admission: RE | Disposition: A | Discharge status: Home / Self Care | Payer: Self-pay | Source: Home / Self Care | Attending: Gastroenterology

## 2020-03-09 ENCOUNTER — Ambulatory Visit: Payer: 59 | Admitting: Certified Registered"

## 2020-03-09 ENCOUNTER — Other Ambulatory Visit: Payer: Self-pay

## 2020-03-09 DIAGNOSIS — K635 Polyp of colon: Secondary | ICD-10-CM | POA: Diagnosis not present

## 2020-03-09 DIAGNOSIS — D122 Benign neoplasm of ascending colon: Secondary | ICD-10-CM | POA: Diagnosis not present

## 2020-03-09 DIAGNOSIS — Z79899 Other long term (current) drug therapy: Secondary | ICD-10-CM | POA: Diagnosis not present

## 2020-03-09 DIAGNOSIS — Z791 Long term (current) use of non-steroidal anti-inflammatories (NSAID): Secondary | ICD-10-CM | POA: Insufficient documentation

## 2020-03-09 DIAGNOSIS — D125 Benign neoplasm of sigmoid colon: Secondary | ICD-10-CM | POA: Diagnosis not present

## 2020-03-09 DIAGNOSIS — I10 Essential (primary) hypertension: Secondary | ICD-10-CM | POA: Diagnosis not present

## 2020-03-09 DIAGNOSIS — G4730 Sleep apnea, unspecified: Secondary | ICD-10-CM | POA: Diagnosis not present

## 2020-03-09 DIAGNOSIS — K621 Rectal polyp: Secondary | ICD-10-CM | POA: Insufficient documentation

## 2020-03-09 DIAGNOSIS — Z1211 Encounter for screening for malignant neoplasm of colon: Secondary | ICD-10-CM | POA: Diagnosis not present

## 2020-03-09 DIAGNOSIS — F329 Major depressive disorder, single episode, unspecified: Secondary | ICD-10-CM | POA: Insufficient documentation

## 2020-03-09 HISTORY — PX: COLONOSCOPY WITH PROPOFOL: SHX5780

## 2020-03-09 SURGERY — COLONOSCOPY WITH PROPOFOL
Anesthesia: General

## 2020-03-09 MED ORDER — PROPOFOL 10 MG/ML IV BOLUS
INTRAVENOUS | Status: DC | PRN
  Administered 2020-03-09 (×2): 50 mg via INTRAVENOUS

## 2020-03-09 MED ORDER — SODIUM CHLORIDE 0.9 % IV SOLN
INTRAVENOUS | Status: DC
  Administered 2020-03-09: 20 mL/h via INTRAVENOUS

## 2020-03-09 MED ORDER — PROPOFOL 500 MG/50ML IV EMUL
INTRAVENOUS | Status: AC
  Filled 2020-03-09: qty 50

## 2020-03-09 MED ORDER — PROPOFOL 500 MG/50ML IV EMUL
INTRAVENOUS | Status: DC | PRN
  Administered 2020-03-09: 150 ug/kg/min via INTRAVENOUS

## 2020-03-09 NOTE — Anesthesia Preprocedure Evaluation (Signed)
Anesthesia Evaluation  Patient identified by MRN, date of birth, ID band Patient awake    Reviewed: Allergy & Precautions, NPO status , Patient's Chart, lab work & pertinent test results  History of Anesthesia Complications Negative for: history of anesthetic complications  Airway Mallampati: II  TM Distance: >3 FB Neck ROM: Full   Comment: Large beard Dental  (+) Teeth Intact, Missing   Pulmonary sleep apnea , neg COPD, Patient abstained from smoking.Not current smoker,  Patient's wife has told him that he snores and sometimes stops breathing at night. Never formally tested for OSA   Pulmonary exam normal breath sounds clear to auscultation       Cardiovascular Exercise Tolerance: Good METShypertension, (-) CAD and (-) Past MI negative cardio ROS  (-) dysrhythmias  Rhythm:Regular Rate:Normal - Systolic murmurs    Neuro/Psych PSYCHIATRIC DISORDERS Depression negative neurological ROS     GI/Hepatic neg GERD  ,(+)     (-) substance abuse  ,   Endo/Other  neg diabetes  Renal/GU negative Renal ROS     Musculoskeletal   Abdominal   Peds  Hematology   Anesthesia Other Findings Past Medical History: No date: Allergy No date: Hypertension  Reproductive/Obstetrics                             Anesthesia Physical Anesthesia Plan  ASA: II  Anesthesia Plan: General   Post-op Pain Management:    Induction: Intravenous  PONV Risk Score and Plan: 2 and Ondansetron, Propofol infusion and TIVA  Airway Management Planned: Nasal Cannula  Additional Equipment: None  Intra-op Plan:   Post-operative Plan:   Informed Consent: I have reviewed the patients History and Physical, chart, labs and discussed the procedure including the risks, benefits and alternatives for the proposed anesthesia with the patient or authorized representative who has indicated his/her understanding and acceptance.      Dental advisory given  Plan Discussed with: CRNA and Surgeon  Anesthesia Plan Comments: (Discussed risks of anesthesia with patient, including possibility of difficulty with spontaneous ventilation under anesthesia necessitating airway intervention, PONV, and rare risks such as cardiac or respiratory or neurological events. Patient understands. Encouraged patient to follow up with primary care physician regarding possible OSA / testing.)        Anesthesia Quick Evaluation

## 2020-03-09 NOTE — H&P (Signed)
Jonathon Bellows, MD [address] [place], [place], [place], [address] [id number], [place], [place], [place], [address] Phone: [phone]  Fax: [phone]  Primary Care Physician:  [name], PA-C   Pre-Procedure History & Physical: HPI:  Kyle Pollard is a 59 y.o. male is here for an colonoscopy.   Past Medical History:  Diagnosis Date  . Allergy   . Hypertension     Past Surgical History:  Procedure Laterality Date  . Broken Leg  1992  . VASECTOMY      Prior to Admission medications   Medication Sig Start Date End Date Taking? Authorizing Provider  FLUoxetine (PROZAC) 20 MG tablet TAKE ONE TABLET BY MOUTH DAILY 12/31/19  Yes [name], PA-C  meloxicam (MOBIC) 15 MG tablet TAKE ONE TABLET BY MOUTH DAILY 02/12/20  Yes [name], PA-C  olmesartan (BENICAR) 40 MG tablet TAKE ONE TABLET BY MOUTH DAILY 02/12/20  Yes Fenton Malling M, PA-C  tadalafil (CIALIS) 5 MG tablet TAKE ONE TABLET BY MOUTH DAILY 01/22/20  Yes [name], PA-C    Allergies as of 01/27/2020 - Review Complete 01/27/2020  Allergen Reaction Noted  . Penicillins Rash 09/14/2015    Family History  Problem Relation Age of Onset  . Dementia Mother   . Prostate cancer Neg Hx   . Bladder Cancer Neg Hx   . Kidney cancer Neg Hx     Social History   Socioeconomic History  . Marital status: Married    Spouse name: Not on file  . Number of children: Not on file  . Years of education: Not on file  . Highest education level: Not on file  Occupational History  . Not on file  Tobacco Use  . Smoking status: Never Smoker  . Smokeless tobacco: Current User  . Tobacco comment: Tobacco-dips  Vaping Use  . Vaping Use: Never used  Substance and Sexual Activity  . Alcohol use: Yes  . Drug use: No  . Sexual activity: Yes  Other Topics Concern  . Not on file  Social History Narrative  . Not on file   Social Determinants of Health   Financial Resource  Strain:   . Difficulty of Paying Living Expenses: Not on file  Food Insecurity:   . Worried About Charity fundraiser in the Last Year: Not on file  . Ran Out of Food in the Last Year: Not on file  Transportation Needs:   . Lack of Transportation (Medical): Not on file  . Lack of Transportation (Non-Medical): Not on file  Physical Activity:   . Days of Exercise per Week: Not on file  . Minutes of Exercise per Session: Not on file  Stress:   . Feeling of Stress : Not on file  Social Connections:   . Frequency of Communication with Friends and Family: Not on file  . Frequency of Social Gatherings with Friends and Family: Not on file  . Attends Religious Services: Not on file  . Active Member of Clubs or Organizations: Not on file  . Attends Archivist Meetings: Not on file  . Marital Status: Not on file  Intimate Partner Violence:   . Fear of Current or Ex-Partner: Not on file  . Emotionally Abused: Not on file  . Physically Abused: Not on file  . Sexually Abused: Not on file    Review of Systems: See HPI, otherwise negative ROS  Physical Exam: BP 132/83   Pulse 65  Temp (!) 96.5 F (35.8 C) (Temporal)   Resp 20   Ht 6' (1.829 m)   Wt 115.7 kg   SpO2 98%   BMI 34.58 kg/m  General:   Alert,  pleasant and cooperative in NAD Head:  Normocephalic and atraumatic. Neck:  Supple; no masses or thyromegaly. Lungs:  Clear throughout to auscultation, normal respiratory effort.    Heart:  +S1, +S2, Regular rate and rhythm, No edema. Abdomen:  Soft, nontender and nondistended. Normal bowel sounds, without guarding, and without rebound.   Neurologic:  Alert and  oriented x4;  grossly normal neurologically.  Impression/Plan: Kyle Pollard is here for an colonoscopy to be performed for Screening colonoscopy average risk   Risks, benefits, limitations, and alternatives regarding  colonoscopy have been reviewed with the patient.  Questions have been answered.  All parties  agreeable.   Jonathon Bellows, MD  03/09/2020, 9:45 AM

## 2020-03-09 NOTE — Transfer of Care (Signed)
Immediate Anesthesia Transfer of Care Note  Patient: Kyle Pollard  Procedure(s) Performed: COLONOSCOPY WITH PROPOFOL (N/A )  Patient Location: PACU and Endoscopy Unit  Anesthesia Type:General  Level of Consciousness: drowsy  Airway & Oxygen Therapy: Patient Spontanous Breathing  Post-op Assessment: Report given to RN  Post vital signs: stable  Last Vitals:  Vitals Value Taken Time  BP    Temp    Pulse    Resp    SpO2      Last Pain:  Vitals:   03/09/20 0909  TempSrc: Temporal  PainSc: 0-No pain         Complications: No complications documented.

## 2020-03-09 NOTE — Op Note (Signed)
[facility] Gastroenterology Patient Name: Kyle Pollard Procedure Date: 03/09/2020 9:41 AM MRN: [id number] Account #: [id number] Date of Birth: 03/14/1961 Admit Type: Outpatient Age: 59 Room: [facility] ENDO ROOM 1 Gender: Male Note Status: Finalized Procedure:             Colonoscopy Indications:           Screening for colorectal malignant neoplasm Providers:             Jonathon Bellows MD, MD Referring MD:          [name] (Referring MD) Medicines:             Monitored Anesthesia Care Complications:         No immediate complications. Procedure:             Pre-Anesthesia Assessment:                        - Prior to the procedure, a History and Physical was                         performed, and patient medications, allergies and                         sensitivities were reviewed. The patient's tolerance                         of previous anesthesia was reviewed.                        - The risks and benefits of the procedure and the                         sedation options and risks were discussed with the                         patient. All questions were answered and informed                         consent was obtained.                        - ASA Grade Assessment: II - A patient with mild                         systemic disease.                        After obtaining informed consent, the colonoscope was                         passed under direct vision. Throughout the procedure,                         the patient's blood pressure, pulse, and oxygen                         saturations were monitored continuously. The                         Colonoscope was introduced through the anus and  advanced to the the cecum, identified by the                         appendiceal orifice. The colonoscopy was performed                         with ease. The patient tolerated the procedure well.                         The quality of  the bowel preparation was excellent. Findings:      The perianal and digital rectal examinations were normal.      Two sessile polyps were found in the rectum and ascending colon. The       polyps were 4 to 5 mm in size. These polyps were removed with a cold       snare. Resection and retrieval were complete.      A 10 mm polyp was found in the sigmoid colon. The polyp was sessile. The       polyp was removed with a cold snare. Resection and retrieval were       complete.      The exam was otherwise without abnormality on direct and retroflexion       views. Impression:            - Two 4 to 5 mm polyps in the rectum and in the cecum,                         removed with a cold snare. Resected and retrieved.                        - One 10 mm polyp in the sigmoid colon, removed with a                         cold snare. Resected and retrieved.                        - The examination was otherwise normal on direct and                         retroflexion views. Recommendation:        - Discharge patient to home (with escort).                        - Resume previous diet.                        - Continue present medications.                        - Await pathology results.                        - Repeat colonoscopy for surveillance based on                         pathology results. Procedure Code(s):     --- Professional ---                        [phone], Colonoscopy, flexible;  with removal of                         tumor(s), polyp(s), or other lesion(s) by snare                         technique Diagnosis Code(s):     --- Professional ---                        Z12.11, Encounter for screening for malignant neoplasm                         of colon                        K62.1, Rectal polyp                        K63.5, Polyp of colon CPT copyright 2019 American Medical Association. All rights reserved. The codes documented in this report are preliminary and upon coder review may   be revised to meet current compliance requirements. Jonathon Bellows, MD Jonathon Bellows MD, MD 03/09/2020 10:12:02 AM This report has been signed electronically. Number of Addenda: 0 Note Initiated On: 03/09/2020 9:41 AM Scope Withdrawal Time: 0 hours 17 minutes 2 seconds  Total Procedure Duration: 0 hours 20 minutes 23 seconds  Estimated Blood Loss:  Estimated blood loss: none.      [facility]

## 2020-03-09 NOTE — Anesthesia Postprocedure Evaluation (Signed)
Anesthesia Post Note  Patient: Kyle Pollard  Procedure(s) Performed: COLONOSCOPY WITH PROPOFOL (N/A )  Patient location during evaluation: Endoscopy Anesthesia Type: General Level of consciousness: awake and alert Pain management: pain level controlled Vital Signs Assessment: post-procedure vital signs reviewed and stable Respiratory status: spontaneous breathing, nonlabored ventilation, respiratory function stable and patient connected to nasal cannula oxygen Cardiovascular status: blood pressure returned to baseline and stable Postop Assessment: no apparent nausea or vomiting Anesthetic complications: no   No complications documented.   Last Vitals:  Vitals:   03/09/20 0909 03/09/20 1013  BP: 132/83   Pulse: 65 67  Resp: 20 11  Temp: (!) 35.8 C (!) 35.7 C  SpO2: 98% 98%    Last Pain:  Vitals:   03/09/20 1013  TempSrc: Temporal  PainSc:                  Arita Miss

## 2020-03-10 ENCOUNTER — Encounter: Payer: Self-pay | Admitting: Gastroenterology

## 2020-03-10 LAB — SURGICAL PATHOLOGY

## 2020-03-11 ENCOUNTER — Encounter: Payer: Self-pay | Admitting: Gastroenterology

## 2020-03-27 ENCOUNTER — Other Ambulatory Visit: Payer: Self-pay | Admitting: Physician Assistant

## 2020-03-27 DIAGNOSIS — F4323 Adjustment disorder with mixed anxiety and depressed mood: Secondary | ICD-10-CM

## 2020-04-20 ENCOUNTER — Other Ambulatory Visit: Payer: Self-pay | Admitting: Physician Assistant

## 2020-04-20 ENCOUNTER — Encounter: Payer: Self-pay | Admitting: Physician Assistant

## 2020-04-20 DIAGNOSIS — N411 Chronic prostatitis: Secondary | ICD-10-CM

## 2020-04-20 DIAGNOSIS — R350 Frequency of micturition: Secondary | ICD-10-CM

## 2020-04-20 NOTE — Telephone Encounter (Signed)
Requested Prescriptions  Pending Prescriptions Disp Refills  . tadalafil (CIALIS) 5 MG tablet [Pharmacy Med Name: TADALAFIL 5 MG TABLET] 90 tablet 0    Sig: TAKE ONE TABLET BY MOUTH DAILY     Urology: Erectile Dysfunction Agents Passed - 04/20/2020  9:00 AM      Passed - Last BP in normal range    BP Readings from Last 1 Encounters:  03/09/20 123/77         Passed - Valid encounter within last 12 months    Recent Outpatient Visits          2 months ago Annual physical exam   [place], [place]   3 months ago Diverticulitis   [facility] Fenton Malling M, [place]   9 months ago Urinary frequency   [place], [place]   12 months ago Acute right-sided low back pain with right-sided sciatica   [facility], Fabio Bering M, [place]   1 year ago Acute midline low back pain without sciatica   [facility], Dionne Bucy, [place]      Future Appointments            In 10 months [place], Clearnce Sorrel, PA-C [employer], [place]

## 2020-05-13 ENCOUNTER — Other Ambulatory Visit: Payer: Self-pay | Admitting: Physician Assistant

## 2020-05-13 DIAGNOSIS — M170 Bilateral primary osteoarthritis of knee: Secondary | ICD-10-CM

## 2020-05-13 DIAGNOSIS — I10 Essential (primary) hypertension: Secondary | ICD-10-CM

## 2020-05-13 NOTE — Telephone Encounter (Signed)
Requested medications are due for refill today yes  Requested medications are on the active medication list yes  Last refill 8/18  Last visit 01/2020  Future visit scheduled yes 01/2021  Notes to clinic Unsure if this was to be continued.

## 2020-06-01 ENCOUNTER — Encounter: Payer: Self-pay | Admitting: Physician Assistant

## 2020-06-01 ENCOUNTER — Ambulatory Visit: Payer: 59 | Admitting: Physician Assistant

## 2020-06-26 ENCOUNTER — Other Ambulatory Visit: Payer: Self-pay | Admitting: Physician Assistant

## 2020-06-26 DIAGNOSIS — F4323 Adjustment disorder with mixed anxiety and depressed mood: Secondary | ICD-10-CM

## 2020-06-28 NOTE — Telephone Encounter (Signed)
Requested Prescriptions  Pending Prescriptions Disp Refills  . FLUoxetine (PROZAC) 20 MG tablet [Pharmacy Med Name: FLUoxetine HCL 20 MG TABLET] 90 tablet 3    Sig: TAKE ONE TABLET BY MOUTH DAILY     Psychiatry:  Antidepressants - SSRI Passed - 06/26/2020  9:23 AM      Passed - Completed PHQ-2 or PHQ-9 in the last 360 days      Passed - Valid encounter within last 6 months    Recent Outpatient Visits          4 months ago Annual physical exam   [facility] [place], Alessandra [place]vel[place], [place]   5 months ago Diverticulitis   [facility] Joycelyn Man M, [place]   11 months ago Urinary frequency   [facility] [place], [place], [place]   1 year ago Acute right-sided low back pain with right-sided sciatica   [facility] Osvaldo Angst M, PA-C   1 year ago Acute midline low back pain without sciatica   [facility], Marzella Schlein, MD      Future Appointments            In 7 months Rosezetta Schlatter, Alessandra Bevels, PA-C [facility], [facility]           '

## 2020-07-08 ENCOUNTER — Other Ambulatory Visit: Payer: Self-pay | Admitting: Physician Assistant

## 2020-07-08 MED ORDER — FLUOXETINE HCL 20 MG PO CAPS: 20.0000 mg | ORAL_CAPSULE | Freq: Every day | ORAL | 3 refills | Status: DC

## 2020-07-16 ENCOUNTER — Ambulatory Visit: Payer: 59 | Admitting: Surgery

## 2020-07-23 ENCOUNTER — Encounter: Payer: Self-pay | Admitting: Physician Assistant

## 2020-07-23 ENCOUNTER — Ambulatory Visit (INDEPENDENT_AMBULATORY_CARE_PROVIDER_SITE_OTHER): Payer: 59 | Admitting: Surgery

## 2020-07-23 ENCOUNTER — Encounter: Payer: Self-pay | Admitting: Surgery

## 2020-07-23 ENCOUNTER — Other Ambulatory Visit: Payer: Self-pay

## 2020-07-23 DIAGNOSIS — K429 Umbilical hernia without obstruction or gangrene: Secondary | ICD-10-CM | POA: Diagnosis not present

## 2020-07-23 NOTE — Patient Instructions (Signed)
If you have any concerns or questions, please feel free to call our office.    

## 2020-07-23 NOTE — Progress Notes (Signed)
Surgical Clinic Progress/Follow-up Note   HPI:  60 y.o. Male presents to clinic for umbilical hernia follow-up  Months follow the last evaluation. Patient reports  improvement/resolution of prior issues and has been tolerating regular diet with +flatus and normal BM's, denies N/V, fever/chills, CP, or SOB.  Colonoscopy completed with benign polyps removed.   He's lost 20 lbs and feels better.  Senses no urgency in proceeding with repair.   Review of Systems:  Constitutional: denies fever/chills  ENT: denies sore throat, hearing problems  Respiratory: denies shortness of breath, wheezing  Cardiovascular: denies chest pain, palpitations  Gastrointestinal: denies abdominal pain, N/V,  Skin: Denies any other rashes or skin discolorations except post-surgical wounds  Vital Signs:  There were no vitals taken for this visit.   Physical Exam:  Constitutional:  -- Normal/Obese body habitus  -- Awake, alert, and oriented x3  Pulmonary:  -- No crackles -- Equal breath sounds bilaterally -- Breathing non-labored at rest Cardiovascular:  -- S1, S2 present  -- No pericardial rubs  Gastrointestinal:  -- Soft and non-distended, non-tender/with no tenderness to palpation, no guarding/rebound tenderness Readily reducible umbilical mass with 1 cm fascial defect non-tender.    Musculoskeletal / Integumentary:  -- Wounds or skin discoloration: None appreciated  -- Extremities: B/L UE and LE FROM, hands and feet warm, no edema   Laboratory studies: none  Imaging: No new pertinent imaging available for review   Assessment:  60 y.o. yo Male with a problem list including...  Patient Active Problem List   Diagnosis Date Noted  . Diastasis recti 01/16/2020  . Umbilical hernia without obstruction and without gangrene 01/16/2020  . Encounter for screening colonoscopy 01/16/2020  . Obese 01/01/2020  . Primary osteoarthritis involving multiple joints 11/11/2015  . Clinical depression 09/14/2015  .  BP (high blood pressure) 09/14/2015  . Triggering of digit 09/14/2015  . Chronic prostatitis 09/14/2015    presents to clinic for follow-up evaluation of umbilical hernia, progressing well.  Plan:              - return to clinic as needed, instructed to call office if any questions or concerns Will defer elective repair for now, will d/w pt when he's ready to proceed.  All of the above recommendations were discussed with the patient, and all of patient's questions were answered to his expressed satisfaction.  Ronny Bacon, MD, FACS [place]: [place] for exceptional care. Office: [phone]

## 2020-07-24 ENCOUNTER — Other Ambulatory Visit: Payer: Self-pay | Admitting: Physician Assistant

## 2020-07-24 DIAGNOSIS — N411 Chronic prostatitis: Secondary | ICD-10-CM

## 2020-07-24 DIAGNOSIS — R350 Frequency of micturition: Secondary | ICD-10-CM

## 2020-08-10 ENCOUNTER — Other Ambulatory Visit: Payer: Self-pay | Admitting: Physician Assistant

## 2020-08-10 DIAGNOSIS — I10 Essential (primary) hypertension: Secondary | ICD-10-CM

## 2020-09-08 IMAGING — US US ABDOMEN LIMITED
1 series · 14 of 25 positions shown · non-contrast
Comparison: None.

CLINICAL DATA: Palpable fullness umbilicus region.

EXAM:
ULTRASOUND PERIUMBILICAL ABDOMINAL WALL

[Series 1: us abdomen limited · 0.07mm/px · 14 of 26 slices shown]
[im 1/26]
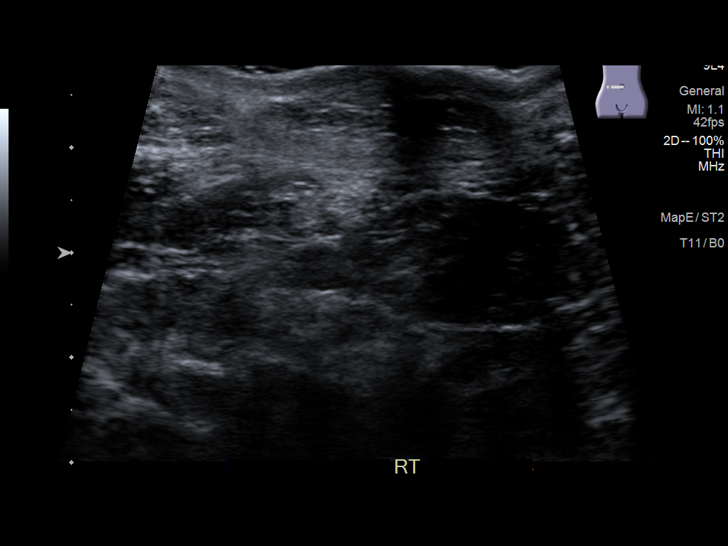
[im 3/26]
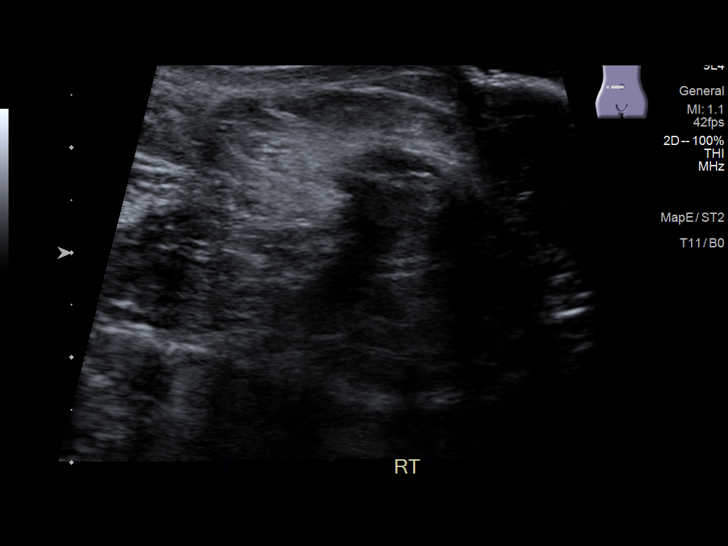
[im 5/26]
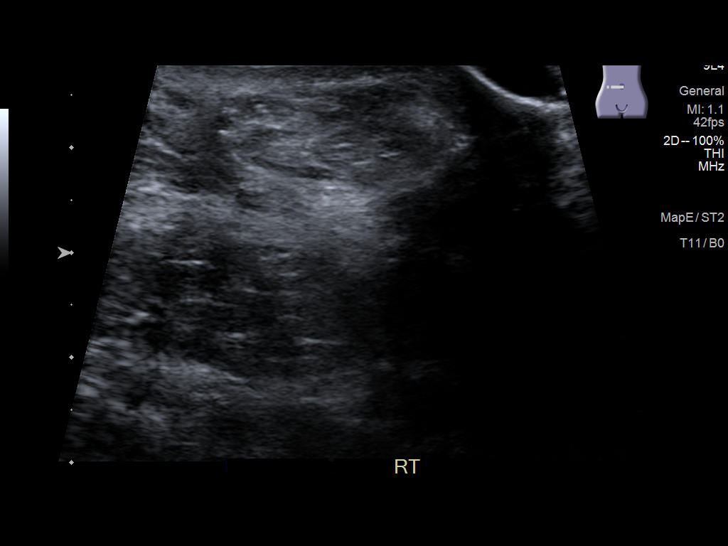
[im 7/26]
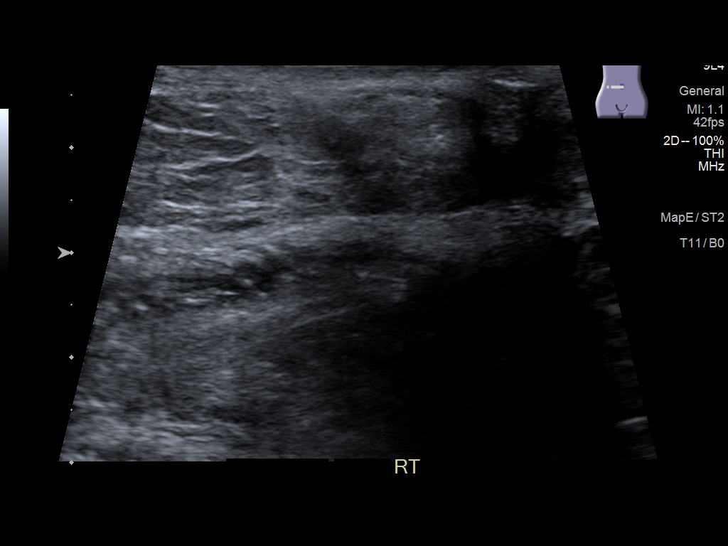
[im 9/26]
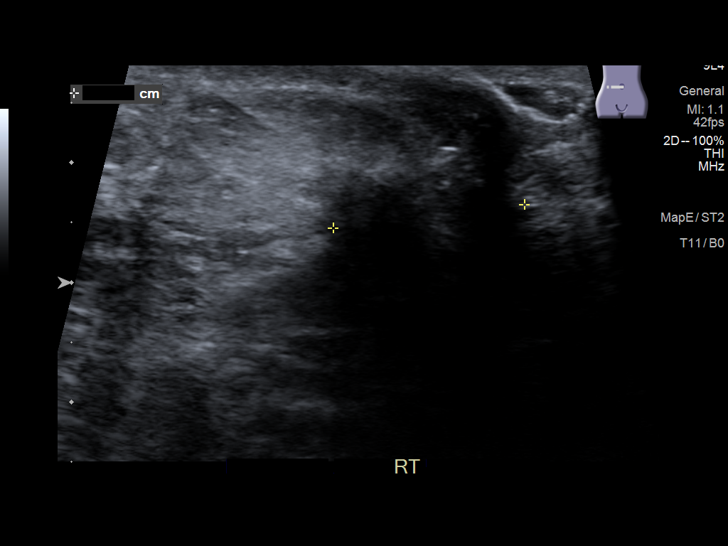
[im 10/26]
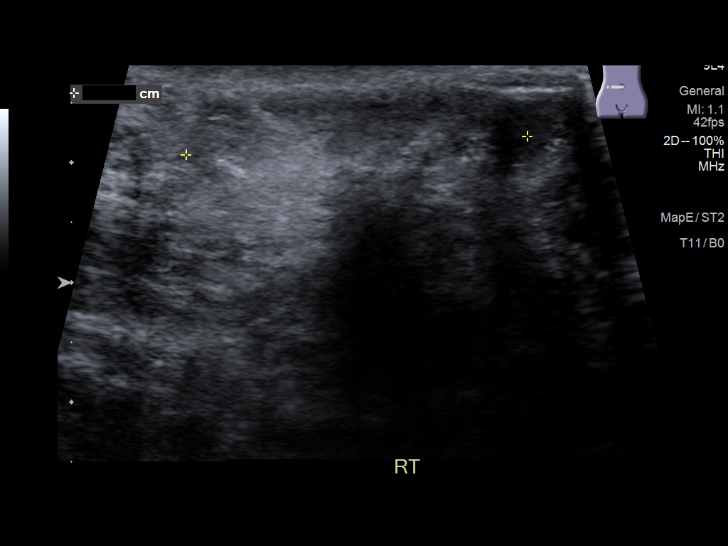
[im 12/26]
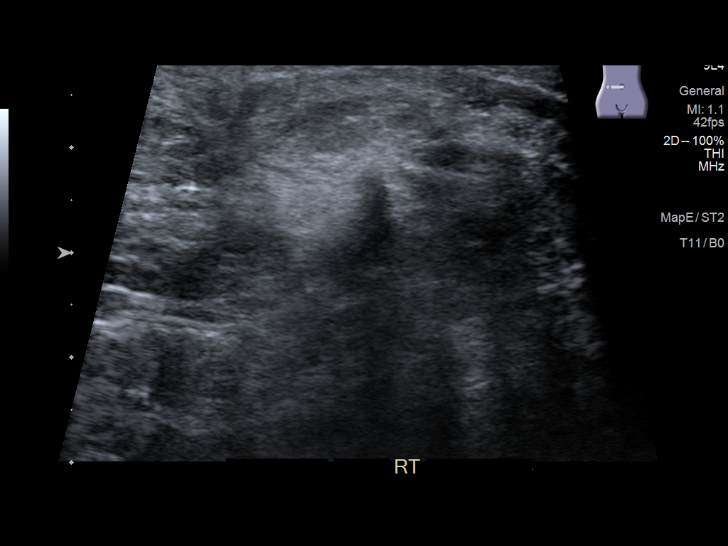
[im 14/26]
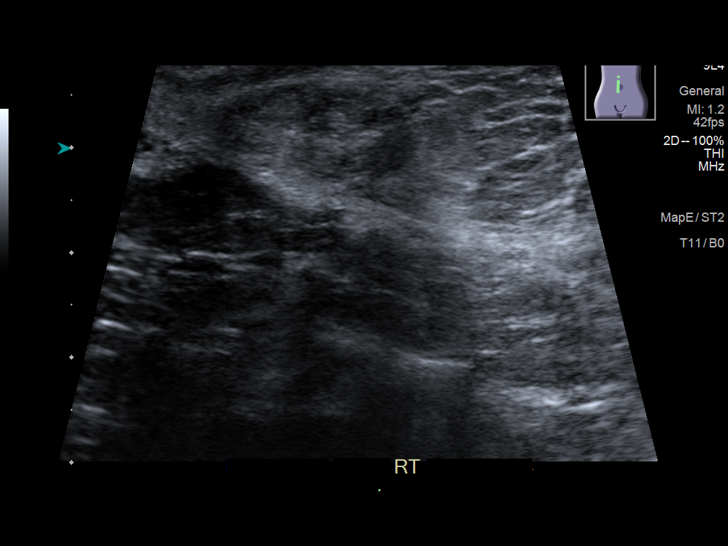
[im 16/26]
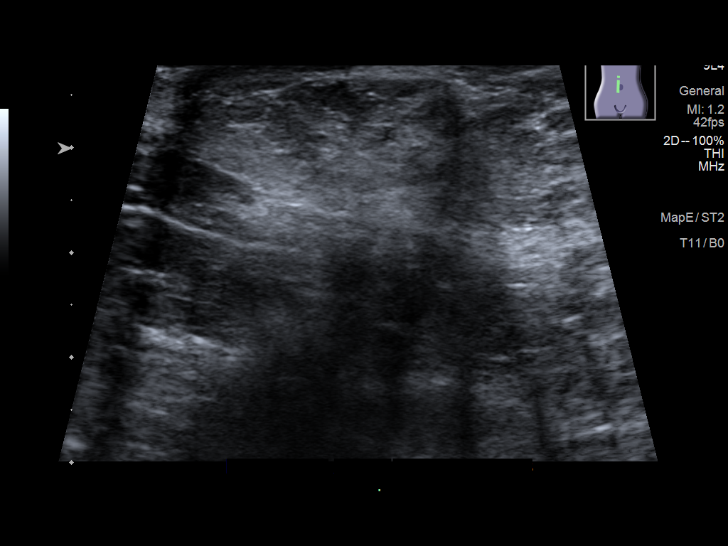
[im 17/26]
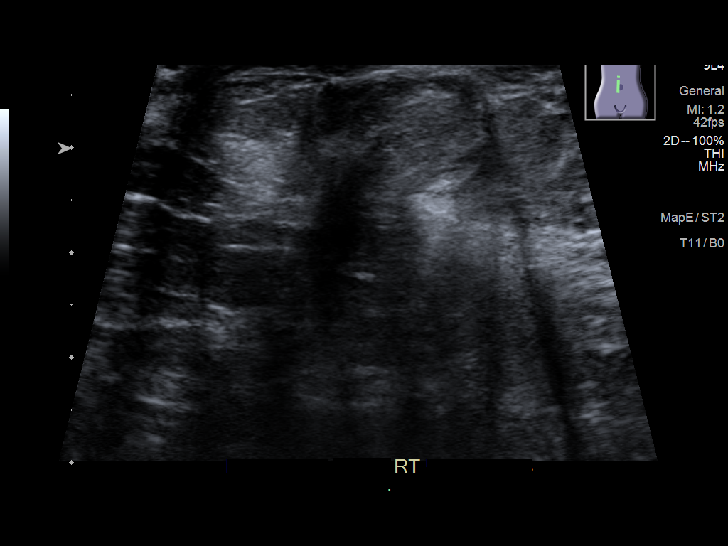
[im 19/26]
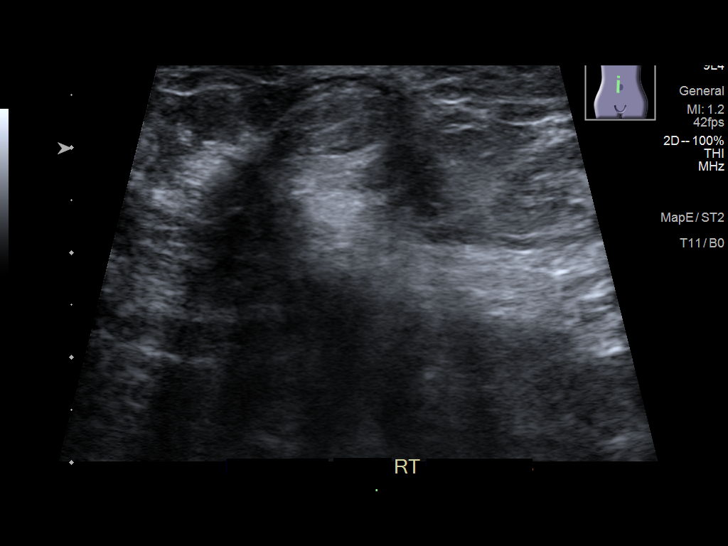
[im 21/26]
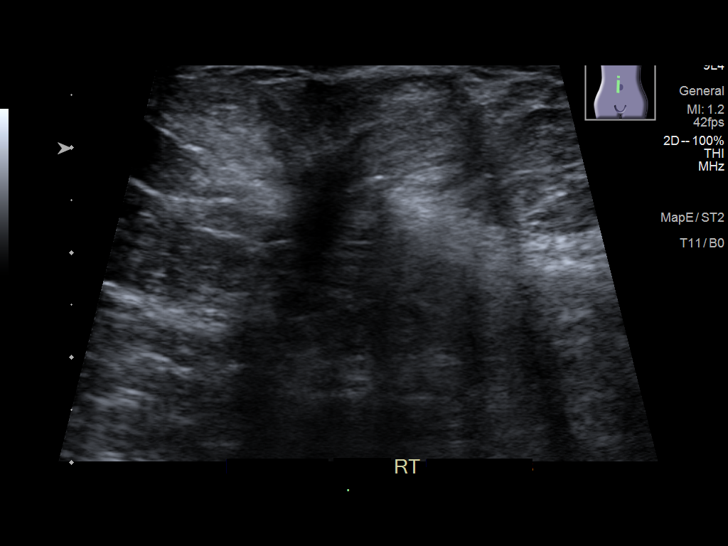
[im 23/26]
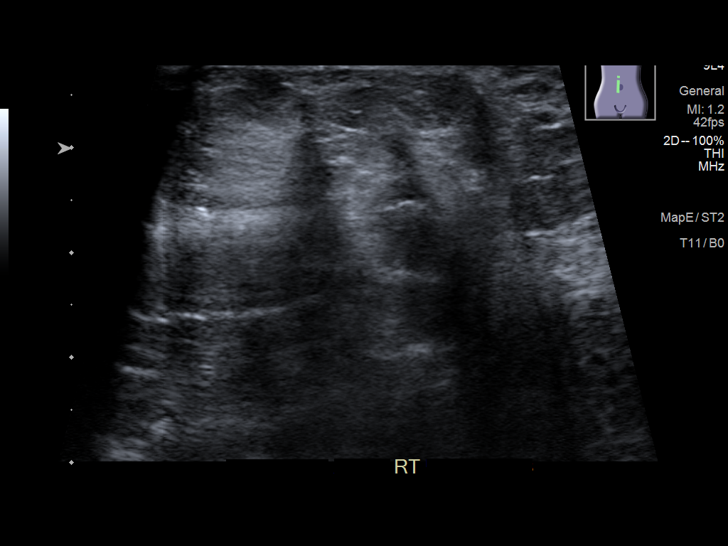
[im 26/26]
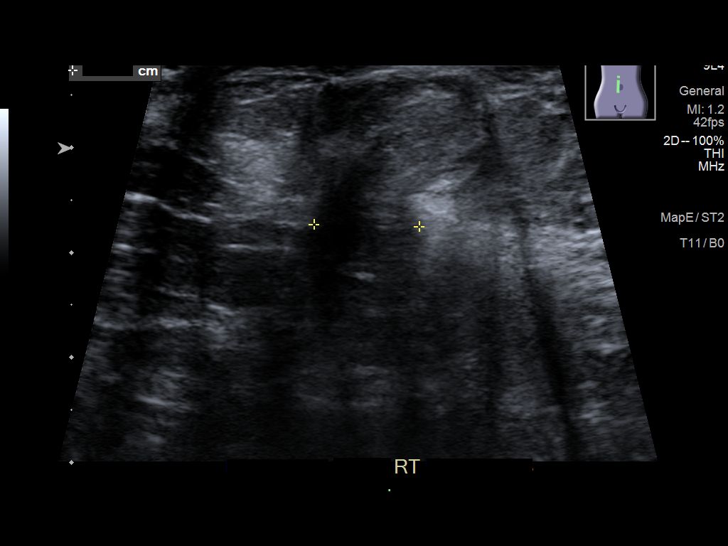

[14 of 25 positions shown; findings below may reference images not displayed]

FINDINGS: Longitudinal and transverse images were obtained at the level of
palpable fullness at the periumbilical level within without Valsalva
maneuver. There is an apparent hernia at the site of palpable
fullness measuring 1.9 x 1.2 x 2.9 cm. There is apparent peristalsis
bowel in this hernia as well as fat. No abnormal fluid or
inflammation in this area. No focal mass in this area.
IMPRESSION: Apparent periumbilical hernia at the site of palpable fullness.
There appears to be bowel and fat in this area of herniation. This
finding may warrant correlation with CT abdomen, ideally with oral
contrast, to further assess with respect to amount of bowel in this
area as well as to more precisely delineate the hernia size.

## 2020-09-29 ENCOUNTER — Encounter: Payer: Self-pay | Admitting: Physician Assistant

## 2020-09-29 DIAGNOSIS — J0140 Acute pansinusitis, unspecified: Secondary | ICD-10-CM

## 2020-09-30 MED ORDER — DOXYCYCLINE HYCLATE 100 MG PO TABS: 100.0000 mg | ORAL_TABLET | Freq: Two times a day (BID) | ORAL | 0 refills | Status: DC

## 2020-11-22 ENCOUNTER — Other Ambulatory Visit: Payer: Self-pay | Admitting: Physician Assistant

## 2020-11-22 DIAGNOSIS — M170 Bilateral primary osteoarthritis of knee: Secondary | ICD-10-CM

## 2021-02-03 ENCOUNTER — Other Ambulatory Visit: Payer: Self-pay | Admitting: Physician Assistant

## 2021-02-03 DIAGNOSIS — R350 Frequency of micturition: Secondary | ICD-10-CM

## 2021-02-03 DIAGNOSIS — N411 Chronic prostatitis: Secondary | ICD-10-CM

## 2021-02-04 ENCOUNTER — Encounter: Payer: Self-pay | Admitting: Family Medicine

## 2021-02-04 ENCOUNTER — Other Ambulatory Visit: Payer: Self-pay

## 2021-02-04 MED ORDER — FLUOXETINE HCL 20 MG PO CAPS: 20.0000 mg | ORAL_CAPSULE | Freq: Every day | ORAL | 1 refills | Status: DC

## 2021-02-10 ENCOUNTER — Other Ambulatory Visit: Payer: Self-pay | Admitting: Physician Assistant

## 2021-02-10 DIAGNOSIS — I10 Essential (primary) hypertension: Secondary | ICD-10-CM

## 2021-02-12 ENCOUNTER — Encounter: Payer: Self-pay | Admitting: Family Medicine

## 2021-02-12 ENCOUNTER — Other Ambulatory Visit: Payer: Self-pay | Admitting: Physician Assistant

## 2021-02-12 DIAGNOSIS — N411 Chronic prostatitis: Secondary | ICD-10-CM

## 2021-02-12 DIAGNOSIS — R350 Frequency of micturition: Secondary | ICD-10-CM

## 2021-02-15 ENCOUNTER — Encounter: Payer: Self-pay | Admitting: Physician Assistant

## 2021-02-16 ENCOUNTER — Other Ambulatory Visit: Payer: Self-pay | Admitting: Family Medicine

## 2021-02-16 DIAGNOSIS — R350 Frequency of micturition: Secondary | ICD-10-CM

## 2021-02-16 DIAGNOSIS — N411 Chronic prostatitis: Secondary | ICD-10-CM

## 2021-02-16 MED ORDER — TADALAFIL 5 MG PO TABS: 5.0000 mg | ORAL_TABLET | Freq: Every day | ORAL | 1 refills | Status: DC

## 2021-02-19 ENCOUNTER — Other Ambulatory Visit: Payer: Self-pay

## 2021-02-19 ENCOUNTER — Encounter: Payer: Self-pay | Admitting: Family Medicine

## 2021-02-19 ENCOUNTER — Ambulatory Visit (INDEPENDENT_AMBULATORY_CARE_PROVIDER_SITE_OTHER): Payer: 59 | Admitting: Family Medicine

## 2021-02-19 VITALS — BP 127/79 | HR 58 | Temp 97.3°F | Resp 16 | Ht 72.0 in | Wt 268.0 lb

## 2021-02-19 DIAGNOSIS — K429 Umbilical hernia without obstruction or gangrene: Secondary | ICD-10-CM

## 2021-02-19 DIAGNOSIS — Z125 Encounter for screening for malignant neoplasm of prostate: Secondary | ICD-10-CM | POA: Diagnosis not present

## 2021-02-19 DIAGNOSIS — I10 Essential (primary) hypertension: Secondary | ICD-10-CM

## 2021-02-19 DIAGNOSIS — Z0000 Encounter for general adult medical examination without abnormal findings: Secondary | ICD-10-CM | POA: Diagnosis not present

## 2021-02-19 DIAGNOSIS — Z6836 Body mass index (BMI) 36.0-36.9, adult: Secondary | ICD-10-CM

## 2021-02-19 NOTE — Progress Notes (Signed)
Complete physical exam   Patient: Kyle Pollard   DOB: Dec 25, 1960   60 y.o. Male  MRN: [id number] Visit Date: 02/19/2021  Today's healthcare provider: Vernie Murders, PA-C   Chief Complaint  Patient presents with   Annual Exam   Subjective    Kyle Pollard is a 60 y.o. male who presents today for a complete physical exam.  He reports consuming a general diet. Home exercise routine includes walking. He generally feels fairly well. He reports sleeping fairly well. He does not have additional problems to discuss today.    Past Medical History:  Diagnosis Date   Allergy    Hypertension    Past Surgical History:  Procedure Laterality Date   Broken Leg  1992   COLONOSCOPY WITH PROPOFOL N/A 03/09/2020   Procedure: COLONOSCOPY WITH PROPOFOL;  Surgeon: Jonathon Bellows, MD;  Location: [facility] ENDOSCOPY;  Service: Gastroenterology;  Laterality: N/A;  Went to [facility] today 9/10 for C-19 test about noon   VASECTOMY     Social History   Socioeconomic History   Marital status: Married    Spouse name: Not on file   Number of children: Not on file   Years of education: Not on file   Highest education level: Not on file  Occupational History   Not on file  Tobacco Use   Smoking status: Never   Smokeless tobacco: Current   Tobacco comments:    Tobacco-dips  Vaping Use   Vaping Use: Never used  Substance and Sexual Activity   Alcohol use: Yes   Drug use: No   Sexual activity: Yes  Other Topics Concern   Not on file  Social History Narrative   Not on file   Social Determinants of Health   Financial Resource Strain: Not on file  Food Insecurity: Not on file  Transportation Needs: Not on file  Physical Activity: Not on file  Stress: Not on file  Social Connections: Not on file  Intimate Partner Violence: Not on file   Family Status  Relation Name Status   Mother  Deceased   Father  Deceased   Neg Hx  (Not Specified)   Family History  Problem Relation Age of Onset    Dementia Mother    Prostate cancer Neg Hx    Bladder Cancer Neg Hx    Kidney cancer Neg Hx    Allergies  Allergen Reactions   Penicillins Rash    Patient Care Team: Gwyneth Sprout, FNP as PCP - General (Family Medicine)   Medications: Outpatient Medications Prior to Visit  Medication Sig   FLUoxetine (PROZAC) 20 MG capsule Take 1 capsule (20 mg total) by mouth daily.   meloxicam (MOBIC) 15 MG tablet TAKE ONE TABLET BY MOUTH DAILY   olmesartan (BENICAR) 40 MG tablet TAKE ONE TABLET BY MOUTH DAILY   tadalafil (CIALIS) 5 MG tablet Take 1 tablet (5 mg total) by mouth daily.   [DISCONTINUED] doxycycline (VIBRA-TABS) 100 MG tablet Take 1 tablet (100 mg total) by mouth 2 (two) times daily. (Patient not taking: Reported on 02/19/2021)   No facility-administered medications prior to visit.    Review of Systems  Constitutional:  Negative for appetite change, chills, fatigue and fever.  HENT:  Negative for congestion, ear pain, hearing loss, nosebleeds and trouble swallowing.   Eyes:  Negative for pain and visual disturbance.  Respiratory:  Negative for cough, chest tightness and shortness of breath.   Cardiovascular:  Negative for chest pain, palpitations and leg  swelling.  Gastrointestinal:  Negative for abdominal pain, blood in stool, constipation, diarrhea, nausea and vomiting.  Endocrine: Positive for polyuria. Negative for polydipsia and polyphagia.  Genitourinary:  Positive for frequency. Negative for dysuria and flank pain.  Musculoskeletal:  Positive for neck stiffness. Negative for arthralgias, back pain, joint swelling and myalgias.  Skin:  Negative for color change, rash and wound.  Neurological:  Negative for dizziness, tremors, seizures, speech difficulty, weakness, light-headedness and headaches.  Psychiatric/Behavioral:  Negative for behavioral problems, confusion, decreased concentration, dysphoric mood and sleep disturbance. The patient is not nervous/anxious.   All other  systems reviewed and are negative.    Objective    BP 127/79 (BP Location: Left Arm, Patient Position: Sitting, Cuff Size: Large)   Pulse (!) 58   Temp (!) 97.3 F (36.3 C) (Temporal)   Resp 16   Ht 6' (1.829 m)   Wt 268 lb (121.6 kg)   SpO2 99% Comment: room air  BMI 36.35 kg/m  BP Readings from Last 3 Encounters:  02/19/21 127/79  03/09/20 123/77  02/13/20 (!) 155/81   Wt Readings from Last 3 Encounters: 02/19/21 268 lb (121.6 kg) 03/09/20 255 lb (115.7 kg) 02/13/20 269 lb 9.6 oz (122.3 kg)   Physical Exam Constitutional:      Appearance: He is well-developed.  HENT:     Head: Normocephalic and atraumatic.     Right Ear: External ear normal.     Left Ear: External ear normal.     Nose: Nose normal.  Eyes:     General:        Right eye: No discharge.     Conjunctiva/sclera: Conjunctivae normal.     Pupils: Pupils are equal, round, and reactive to light.  Neck:     Thyroid: No thyromegaly.     Trachea: No tracheal deviation.  Cardiovascular:     Rate and Rhythm: Normal rate and regular rhythm.     Heart sounds: Normal heart sounds. No murmur heard. Pulmonary:     Effort: Pulmonary effort is normal. No respiratory distress.     Breath sounds: Normal breath sounds. No wheezing or rales.  Chest:     Chest wall: No tenderness.  Abdominal:     General: There is no distension.     Palpations: Abdomen is soft. There is no mass.     Tenderness: There is no abdominal tenderness. There is no guarding or rebound.     Hernia: A hernia is present.     Comments: Umbilical hernia. Easily reducible and no pain.  Musculoskeletal:        General: No tenderness. Normal range of motion.     Cervical back: Normal range of motion and neck supple.  Lymphadenopathy:     Cervical: No cervical adenopathy.  Skin:    General: Skin is warm and dry.     Findings: No erythema or rash.  Neurological:     Mental Status: He is alert and oriented to person, place, and time.      Cranial Nerves: No cranial nerve deficit.     Motor: No abnormal muscle tone.     Coordination: Coordination normal.     Deep Tendon Reflexes: Reflexes are normal and symmetric. Reflexes normal.  Psychiatric:        Behavior: Behavior normal.        Thought Content: Thought content normal.        Judgment: Judgment normal.      Last depression screening scores PHQ 2/9  Scores 02/19/2021 02/13/2020 02/06/2019  PHQ - 2 Score 0 0 0  PHQ- 9 Score 0 0 0   Last fall risk screening Fall Risk  02/19/2021  Falls in the past year? 0  Number falls in past yr: 0  Injury with Fall? 0  Risk for fall due to : No Fall Risks  Follow up Falls evaluation completed   Last Audit-C alcohol use screening Alcohol Use Disorder Test (AUDIT) 02/19/2021  1. How often do you have a drink containing alcohol? 4  2. How many drinks containing alcohol do you have on a typical day when you are drinking? 1  3. How often do you have six or more drinks on one occasion? 1  AUDIT-C Score 6  4. How often during the last year have you found that you were not able to stop drinking once you had started? 0  5. How often during the last year have you failed to do what was normally expected from you because of drinking? 0  6. How often during the last year have you needed a first drink in the morning to get yourself going after a heavy drinking session? 0  7. How often during the last year have you had a feeling of guilt of remorse after drinking? 0  8. How often during the last year have you been unable to remember what happened the night before because you had been drinking? 0  9. Have you or someone else been injured as a result of your drinking? 0  10. Has a relative or friend or a doctor or another health worker been concerned about your drinking or suggested you cut down? 0  Alcohol Use Disorder Identification Test Final Score (AUDIT) 6  Alcohol Brief Interventions/Follow-up -   A score of 3 or more in women, and 4 or  more in men indicates increased risk for alcohol abuse, EXCEPT if all of the points are from question 1   No results found for any visits on 02/19/21.  Assessment & Plan    Routine Health Maintenance and Physical Exam  Exercise Activities and Dietary recommendations  Goals   Encouraged to work on weight loss and exercise 30-40 minutes 3-4 days a week.     Immunization History  Administered Date(s) Administered   Tdap 02/06/2019    Health Maintenance  Topic Date Due   COVID-19 Vaccine (1) Never done   Zoster Vaccines- Shingrix (1 of 2) Never done   INFLUENZA VACCINE  01/25/2021   COLONOSCOPY (Pts 45-57yr Insurance coverage will need to be confirmed)  03/10/2023   TETANUS/TDAP  02/05/2029   Hepatitis C Screening  Completed   HIV Screening  Completed   Pneumococcal Vaccine [id number] old  Aged Out   HPV VACCINES  Aged Out    Discussed health benefits of physical activity, and encouraged him to engage in regular exercise appropriate for his age and condition.  1. Annual physical exam Fair general health. Needs to lose weight. Counseled regarding health maintenance and immunizations up dates (COVID, flu, shingles, etc. - will check with pharmacist regarding costs). Check routine labs. - CBC with Differential/Platelet - Comprehensive metabolic panel - PSA - TSH - Lipid panel  2. Essential hypertension Tolerating Benicar 40 mg qd without side effects. No chest pain, dyspnea, palpitations or peripheral edema. Check routine labs and continue present dosage. - CBC with Differential/Platelet - Comprehensive metabolic panel - TSH - Lipid panel  3. Prostate cancer screening - PSA  4. Class 2  severe obesity due to excess calories with serious comorbidity and body mass index (BMI) of 36.0 to 36.9 in adult (HCC) Gained 13 lbs in the past year. Need to get regular exercise for 30-40 minutes 3-4 days a week and reduce caloric intake. Check labs for metabolic disorder. - CBC with  Differential/Platelet - Comprehensive metabolic panel - TSH  5. Umbilical hernia without obstruction and without gangrene Asymptomatic and easily reducible. Schedule surgical evaluation for repair when he is ready.   No follow-ups on file.     I, Estell Dillinger, PA-C, have reviewed all documentation for this visit. The documentation on 03/03/21 for the exam, diagnosis, procedures, and orders are all accurate and complete.    Vernie Murders, PA-C  [employer] [phone] (phone) [phone] (fax)  [place]

## 2021-02-20 LAB — CBC WITH DIFFERENTIAL/PLATELET
Basophils Absolute: 0.1 10*3/uL (ref 0.0–0.2)
Basos: 1 %
EOS (ABSOLUTE): 0.1 10*3/uL (ref 0.0–0.4)
Eos: 2 %
Hematocrit: 46.6 % (ref 37.5–51.0)
Hemoglobin: 15.5 g/dL (ref 13.0–17.7)
Immature Grans (Abs): 0 10*3/uL (ref 0.0–0.1)
Immature Granulocytes: 0 %
Lymphocytes Absolute: 1.7 10*3/uL (ref 0.7–3.1)
Lymphs: 37 %
MCH: 30.5 pg (ref 26.6–33.0)
MCHC: 33.3 g/dL (ref 31.5–35.7)
MCV: 92 fL (ref 79–97)
Monocytes Absolute: 0.7 10*3/uL (ref 0.1–0.9)
Monocytes: 15 %
Neutrophils Absolute: 2.1 10*3/uL (ref 1.4–7.0)
Neutrophils: 45 %
Platelets: 221 10*3/uL (ref 150–450)
RBC: 5.09 x10E6/uL (ref 4.14–5.80)
RDW: 12.7 % (ref 11.6–15.4)
WBC: 4.7 10*3/uL (ref 3.4–10.8)

## 2021-02-20 LAB — TSH: TSH: 2.42 u[IU]/mL (ref 0.450–4.500)

## 2021-02-20 LAB — COMPREHENSIVE METABOLIC PANEL
ALT: 22 IU/L (ref 0–44)
AST: 19 IU/L (ref 0–40)
Albumin/Globulin Ratio: 1.7 (ref 1.2–2.2)
Albumin: 4.4 g/dL (ref 3.8–4.8)
Alkaline Phosphatase: 59 IU/L (ref 44–121)
BUN/Creatinine Ratio: 16 (ref 10–24)
BUN: 15 mg/dL (ref 8–27)
Bilirubin Total: 0.5 mg/dL (ref 0.0–1.2)
CO2: 23 mmol/L (ref 20–29)
Calcium: 9.2 mg/dL (ref 8.6–10.2)
Chloride: 100 mmol/L (ref 96–106)
Creatinine, Ser: 0.94 mg/dL (ref 0.76–1.27)
Globulin, Total: 2.6 g/dL (ref 1.5–4.5)
Glucose: 108 mg/dL — ABNORMAL HIGH (ref 65–99)
Potassium: 5.2 mmol/L (ref 3.5–5.2)
Sodium: 138 mmol/L (ref 134–144)
Total Protein: 7 g/dL (ref 6.0–8.5)
eGFR: 92 mL/min/{1.73_m2} (ref >59)

## 2021-02-20 LAB — LIPID PANEL
Chol/HDL Ratio: 4.6 ratio (ref 0.0–5.0)
Cholesterol, Total: 189 mg/dL (ref 100–199)
HDL: 41 mg/dL (ref >39)
LDL Chol Calc (NIH): 121 mg/dL — ABNORMAL HIGH (ref 0–99)
Triglycerides: 149 mg/dL (ref 0–149)
VLDL Cholesterol Cal: 27 mg/dL (ref 5–40)

## 2021-02-20 LAB — PSA: Prostate Specific Ag, Serum: 0.7 ng/mL (ref 0.0–4.0)

## 2021-03-10 ENCOUNTER — Encounter: Payer: Self-pay | Admitting: Family Medicine

## 2021-03-10 DIAGNOSIS — I10 Essential (primary) hypertension: Secondary | ICD-10-CM

## 2021-03-10 MED ORDER — OLMESARTAN MEDOXOMIL 40 MG PO TABS: 40.0000 mg | ORAL_TABLET | Freq: Every day | ORAL | 1 refills | Status: DC

## 2021-04-01 ENCOUNTER — Other Ambulatory Visit: Payer: Self-pay | Admitting: Family Medicine

## 2021-04-01 NOTE — Telephone Encounter (Signed)
[facility] faxed refill request for the following medications:   FLUoxetine (PROZAC) 20 MG capsule   Please advise.

## 2021-04-02 MED ORDER — FLUOXETINE HCL 20 MG PO CAPS: 20.0000 mg | ORAL_CAPSULE | Freq: Every day | ORAL | 1 refills | Status: DC

## 2021-04-02 NOTE — Telephone Encounter (Signed)
Last refill: 02/04/2021 qty 30, with 1 refill Last office visit: 02/19/2021 Next office visit: none scheduled

## 2021-05-29 ENCOUNTER — Other Ambulatory Visit: Payer: Self-pay | Admitting: Family Medicine

## 2021-05-30 NOTE — Telephone Encounter (Signed)
Requested Prescriptions  Pending Prescriptions Disp Refills  . FLUoxetine (PROZAC) 20 MG capsule [Pharmacy Med Name: FLUoxetine HCL 20 MG CAPSULE] 30 capsule 1    Sig: TAKE ONE CAPSULE BY MOUTH DAILY     Psychiatry:  Antidepressants - SSRI Passed - 05/29/2021  5:03 PM      Passed - Completed PHQ-2 or PHQ-9 in the last 360 days      Passed - Valid encounter within last 6 months    Recent Outpatient Visits          3 months ago Annual physical exam   [place], PA-C   1 year ago Annual physical exam   [place], Clearnce Sorrel, [place]   1 year ago Diverticulitis   [place], Clearnce [place], [place]   1 year ago Urinary frequency   [place], [place], [place]   2 years ago Acute right-sided low back pain with right-sided sciatica   [facility] [place], Fabio Bering M, [place]

## 2021-06-25 ENCOUNTER — Telehealth: Payer: Self-pay | Admitting: Family Medicine

## 2021-06-25 DIAGNOSIS — M170 Bilateral primary osteoarthritis of knee: Secondary | ICD-10-CM

## 2021-06-25 MED ORDER — MELOXICAM 15 MG PO TABS: 15.0000 mg | ORAL_TABLET | Freq: Every day | ORAL | 1 refills | Status: DC

## 2021-06-25 NOTE — Telephone Encounter (Signed)
[facility] faxed refill request for the following medications:  meloxicam (MOBIC) 15 MG tablet   Please advise.

## 2021-08-03 ENCOUNTER — Other Ambulatory Visit: Payer: Self-pay | Admitting: Family Medicine

## 2021-08-20 ENCOUNTER — Encounter: Payer: Self-pay | Admitting: Family Medicine

## 2021-08-20 ENCOUNTER — Other Ambulatory Visit: Payer: Self-pay | Admitting: Family Medicine

## 2021-08-20 DIAGNOSIS — R350 Frequency of micturition: Secondary | ICD-10-CM

## 2021-08-20 DIAGNOSIS — N411 Chronic prostatitis: Secondary | ICD-10-CM

## 2021-08-20 MED ORDER — TADALAFIL 5 MG PO TABS: 5.0000 mg | ORAL_TABLET | Freq: Every day | ORAL | 1 refills | Status: DC

## 2021-09-03 ENCOUNTER — Other Ambulatory Visit: Payer: Self-pay | Admitting: Family Medicine

## 2021-09-03 DIAGNOSIS — I10 Essential (primary) hypertension: Secondary | ICD-10-CM

## 2021-09-06 NOTE — Telephone Encounter (Signed)
Call pt to make an appointment for follow up. Pt states that he has never needed 2x yearly appointments in the past. He asked of he already had an upcoming appt for his yearly  - he does not.  ? ?Pt stated that maybe it was tie for him to change doctors. ?

## 2021-09-06 NOTE — Telephone Encounter (Signed)
Requested medications are due for refill today.  yes ? ?Requested medications are on the active medications list.  yes ? ?Last refill. 03/10/2021 #901 refill ? ?Future visit scheduled.   no ? ?Notes to clinic.  See encounter notes. ? ? ? ?Requested Prescriptions  ?Pending Prescriptions Disp Refills  ? olmesartan (BENICAR) 40 MG tablet [Pharmacy Med Name: OLMESARTAN MEDOXOMIL 40 MG TAB] 90 tablet 1  ?  Sig: TAKE ONE TABLET BY MOUTH DAILY  ?  ? Cardiovascular:  Angiotensin Receptor Blockers Failed - 09/03/2021  7:24 PM  ?  ?  Failed - Cr in normal range and within 180 days  ?  Creatinine, Ser  ?Date Value Ref Range Status  ?02/19/2021 0.94 0.76 - 1.27 mg/dL Final  ?  ?  ?  ?  Failed - K in normal range and within 180 days  ?  Potassium  ?Date Value Ref Range Status  ?02/19/2021 5.2 3.5 - 5.2 mmol/L Final  ?  ?  ?  ?  Failed - Valid encounter within last 6 months  ?  Recent Outpatient Visits   ? ?      ? 6 months ago Annual physical exam  ? [place], PA-C  ? 1 year ago Annual physical exam  ? [facility] [place], Clearnce Sorrel, [place]  ? 1 year ago Diverticulitis  ? [place], [place]  ? 2 years ago Urinary frequency  ? [place], [place]  ? 2 years ago Acute right-sided low back pain with right-sided sciatica  ? [facility] [place], [place], [place]  ? ?  ?  ? ?  ?  ?  Passed - Patient is not pregnant  ?  ?  Passed - Last BP in normal range  ?  BP Readings from Last 1 Encounters:  ?02/19/21 127/79  ?  ?  ?  ?  ?  ?

## 2021-09-06 NOTE — Telephone Encounter (Signed)
I asked pt if he would like me to ask provider to refill without appointment , pt said yeas. ?

## 2021-09-08 NOTE — Progress Notes (Signed)
? ?I,Elena D DeSanto,acting as a scribe for Gwyneth Sprout, FNP.,have documented all relevant documentation on the behalf of Gwyneth Sprout, FNP,as directed by  Gwyneth Sprout, FNP while in the presence of Gwyneth Sprout, FNP. ?  ?Established patient visit ? ?Patient: Kyle Pollard   DOB: 1960-10-28   61 y.o. Male  MRN: [id number] ?Visit Date: 09/09/2021 ? ?Today's healthcare provider: Gwyneth Sprout, FNP  ? ?Introduced to Designer, jewellery role and practice setting.  All questions answered.  Discussed provider/patient relationship and expectations. ? ?Subjective  ?  ?HPI  ?Hypertension, follow-up ? ?BP Readings from Last 3 Encounters:  ?09/09/21 128/74  ?02/19/21 127/79  ?03/09/20 123/77  ? Wt Readings from Last 3 Encounters:  ?09/09/21 260 lb (117.9 kg)  ?02/19/21 268 lb (121.6 kg)  ?03/09/20 255 lb (115.7 kg)  ?  ? ?He was last seen for hypertension 7 months ago.  ?BP at that visit was as above. Management since that visit includes none. ? ?He reports good compliance with treatment. ?He is not having side effects.  ? ?Use of agents associated with hypertension: none.  ? ?Outside blood pressures are 130's over 80's. ?Symptoms: ?No chest pain No chest pressure  ?No palpitations No syncope  ?No dyspnea No orthopnea  ?No paroxysmal nocturnal dyspnea No lower extremity edema  ? ?Pertinent labs: ?Lab Results  ?Component Value Date  ? CHOL 189 02/19/2021  ? HDL 41 02/19/2021  ? [place] 121 (H) 02/19/2021  ? TRIG 149 02/19/2021  ? CHOLHDL 4.6 02/19/2021  ? Lab Results  ?Component Value Date  ? NA 138 02/19/2021  ? K 5.2 02/19/2021  ? CREATININE 0.94 02/19/2021  ? EGFR 92 02/19/2021  ? GLUCOSE 108 (H) 02/19/2021  ? TSH 2.420 02/19/2021  ?  ? ?The 10-year ASCVD risk score (Arnett DK, et al., 2019) is: 12.8%  ? ?--------------------------------------------------------------------------------------------------- ? ?Medications: ?Outpatient Medications Prior to Visit  ?Medication Sig  ? FLUoxetine (PROZAC) 20 MG capsule TAKE ONE  CAPSULE BY MOUTH DAILY  ? meloxicam (MOBIC) 15 MG tablet Take 1 tablet (15 mg total) by mouth daily.  ? tadalafil (CIALIS) 5 MG tablet Take 1 tablet (5 mg total) by mouth daily.  ? [DISCONTINUED] olmesartan (BENICAR) 40 MG tablet Take 1 tablet (40 mg total) by mouth daily.  ? ?No facility-administered medications prior to visit.  ? ?Review of Systems ? ? ?  Objective  ?  ?BP 128/74 (BP Location: Right Arm, Patient Position: Sitting, Cuff Size: Large)   Pulse 63   Temp 98 ?F (36.7 ?C) (Oral)   Wt 260 lb (117.9 kg)   SpO2 97%   BMI 35.26 kg/m?  ? ? ?Physical Exam ?Vitals and nursing note reviewed.  ?Constitutional:   ?   Appearance: Normal appearance. He is obese.  ?HENT:  ?   Head: Normocephalic and atraumatic.  ?Eyes:  ?   Pupils: Pupils are equal, round, and reactive to light.  ?Cardiovascular:  ?   Rate and Rhythm: Normal rate and regular rhythm.  ?   Pulses: Normal pulses.  ?   Heart sounds: Normal heart sounds.  ?Pulmonary:  ?   Effort: Pulmonary effort is normal.  ?   Breath sounds: Normal breath sounds.  ?Musculoskeletal:     ?   General: Normal range of motion.  ?   Cervical back: Normal range of motion.  ?Skin: ?   General: Skin is warm and dry.  ?   Capillary Refill: Capillary refill takes less than 2  seconds.  ?Neurological:  ?   General: No focal deficit present.  ?   Mental Status: He is alert and oriented to person, place, and time. Mental status is at baseline.  ?Psychiatric:     ?   Mood and Affect: Mood normal.     ?   Behavior: Behavior normal.     ?   Thought Content: Thought content normal.     ?   Judgment: Judgment normal.  ?  ?No results found for any visits on 09/09/21. ? Assessment & Plan  ?  ? ?Problem List Items Addressed This Visit   ? ?  ? Cardiovascular and Mediastinum  ? Essential hypertension - Primary  ?  Chronic, stable 128/74 today, HR 63 ?Denies CP ?Denies SOB/ DOE ?Denies low blood pressure/hypotension ?Denies vision changes ?No LE Edema noted on exam ?Continue medication,  Benicar 40 mg Qd ?Denies side effects ?Refills 180# ?Seek emergent care if you develop chest pain or chest pressure ? ?  ?  ? Relevant Medications  ? olmesartan (BENICAR) 40 MG tablet  ?  ? Other  ? Morbid obesity ([place])  ?  Body mass index is 35.26 kg/m?. ?Associated with HTN and anxiety ?Both well controlled with medications ?Discussed importance of healthy weight management ?Discussed diet and exercise ? ?  ?  ? ? ? ?Return in about 6 months (around 03/12/2022) for annual examination.  ?   ? ?I, Gwyneth Sprout, FNP, have reviewed all documentation for this visit. The documentation on 09/09/21 for the exam, diagnosis, procedures, and orders are all accurate and complete. ? ? ? ?Gwyneth Sprout, FNP  ?[place] ?[phone] (phone) ?[phone] (fax) ? ?[facility] ?

## 2021-09-09 ENCOUNTER — Ambulatory Visit (INDEPENDENT_AMBULATORY_CARE_PROVIDER_SITE_OTHER): Payer: 59 | Admitting: Family Medicine

## 2021-09-09 ENCOUNTER — Encounter: Payer: Self-pay | Admitting: Family Medicine

## 2021-09-09 ENCOUNTER — Other Ambulatory Visit: Payer: Self-pay

## 2021-09-09 VITALS — BP 128/74 | HR 63 | Temp 98.0°F | Wt 260.0 lb

## 2021-09-09 DIAGNOSIS — I10 Essential (primary) hypertension: Secondary | ICD-10-CM | POA: Diagnosis not present

## 2021-09-09 MED ORDER — OLMESARTAN MEDOXOMIL 40 MG PO TABS: 40.0000 mg | ORAL_TABLET | Freq: Every day | ORAL | 1 refills | Status: DC

## 2021-09-09 NOTE — Assessment & Plan Note (Signed)
Body mass index is 35.26 kg/m?. ?Associated with HTN and anxiety ?Both well controlled with medications ?Discussed importance of healthy weight management ?Discussed diet and exercise ? ?

## 2021-09-09 NOTE — Assessment & Plan Note (Signed)
Chronic, stable 128/74 today, HR 63 ?Denies CP ?Denies SOB/ DOE ?Denies low blood pressure/hypotension ?Denies vision changes ?No LE Edema noted on exam ?Continue medication, Benicar 40 mg Qd ?Denies side effects ?Refills 180# ?Seek emergent care if you develop chest pain or chest pressure ? ?

## 2021-11-23 ENCOUNTER — Ambulatory Visit (INDEPENDENT_AMBULATORY_CARE_PROVIDER_SITE_OTHER): Payer: 59 | Admitting: Family Medicine

## 2021-11-23 ENCOUNTER — Encounter: Payer: Self-pay | Admitting: Family Medicine

## 2021-11-23 VITALS — BP 147/85 | HR 62 | Temp 97.7°F | Resp 16 | Ht 70.0 in | Wt 263.7 lb

## 2021-11-23 DIAGNOSIS — G8929 Other chronic pain: Secondary | ICD-10-CM | POA: Insufficient documentation

## 2021-11-23 DIAGNOSIS — M7581 Other shoulder lesions, right shoulder: Secondary | ICD-10-CM | POA: Diagnosis not present

## 2021-11-23 DIAGNOSIS — I10 Essential (primary) hypertension: Secondary | ICD-10-CM

## 2021-11-23 MED ORDER — MELOXICAM 15 MG PO TABS: 15.0000 mg | ORAL_TABLET | Freq: Every day | ORAL | 1 refills | Status: DC

## 2021-11-23 MED ORDER — CYCLOBENZAPRINE HCL 10 MG PO TABS: 10.0000 mg | ORAL_TABLET | Freq: Every day | ORAL | 1 refills | Status: DC

## 2021-11-23 NOTE — Assessment & Plan Note (Signed)
Chronic, stable Body mass index is 37.84 kg/m. Associated with HTN, depression, arthritis in multiple joints Discussed importance of healthy weight management Discussed diet and exercise

## 2021-11-23 NOTE — Assessment & Plan Note (Signed)
Chronic, worsening Has been using Mobic to assist Denies mechanism of injury Worse with use of hands on computer Causes associated pins/needle sensation and numbness Would recommend use of muscle relaxant and massage

## 2021-11-23 NOTE — Assessment & Plan Note (Addendum)
Chronic, previous stable Now elevated- likely due to acute pain in R infraspinatus tendon Has been on Mobic for >5 months  Continue Benicar 40 mg

## 2021-11-23 NOTE — Progress Notes (Signed)
Established patient visit   Patient: Kyle Pollard   DOB: 12-28-1960   61 y.o. Male  MRN: [id number] Visit Date: 11/23/2021  Today's healthcare provider: Gwyneth Sprout, FNP  Re Introduced to nurse practitioner role and practice setting.  All questions answered.  Discussed provider/patient relationship and expectations.   I,Tiffany J Bragg,acting as a scribe for Gwyneth Sprout, FNP.,have documented all relevant documentation on the behalf of Gwyneth Sprout, FNP,as directed by  Gwyneth Sprout, FNP while in the presence of Gwyneth Sprout, FNP.   Chief Complaint  Patient presents with   Shoulder Pain    Patient complains of R shoulder pain for 5 months, progressively worse. States it is causing his hand to go numb and having issues falling and staying asleep.   Subjective    HPI HPI     Shoulder Pain    Additional comments: Patient complains of R shoulder pain for 5 months, progressively worse. States it is causing his hand to go numb and having issues falling and staying asleep.      Last edited by Smitty Knudsen, CMA on 11/23/2021  1:07 PM.        ---------------------------------------------------------------------------------------------------   Medications: Outpatient Medications Prior to Visit  Medication Sig   FLUoxetine (PROZAC) 20 MG capsule TAKE ONE CAPSULE BY MOUTH DAILY   olmesartan (BENICAR) 40 MG tablet Take 1 tablet (40 mg total) by mouth daily.   tadalafil (CIALIS) 5 MG tablet Take 1 tablet (5 mg total) by mouth daily.   [DISCONTINUED] meloxicam (MOBIC) 15 MG tablet Take 1 tablet (15 mg total) by mouth daily.   No facility-administered medications prior to visit.    Review of Systems     Objective    BP (!) 147/85 (BP Location: Left Arm, Patient Position: Sitting, Cuff Size: Large)   Pulse 62   Temp 97.7 F (36.5 C) (Oral)   Resp 16   Ht '5\' 10"'$  (1.778 m)   Wt 263 lb 11.2 oz (119.6 kg)   SpO2 98%   BMI 37.84 kg/m    Physical Exam Vitals  and nursing note reviewed.  Constitutional:      Appearance: Normal appearance. He is obese.  HENT:     Head: Normocephalic and atraumatic.  Eyes:     Pupils: Pupils are equal, round, and reactive to light.  Cardiovascular:     Rate and Rhythm: Normal rate and regular rhythm.     Pulses: Normal pulses.     Heart sounds: Normal heart sounds.  Pulmonary:     Effort: Pulmonary effort is normal.     Breath sounds: Normal breath sounds.  Musculoskeletal:        General: Normal range of motion.     Cervical back: Normal range of motion. Tenderness present. Pain with movement present.       Back:  Skin:    General: Skin is warm and dry.     Capillary Refill: Capillary refill takes less than 2 seconds.  Neurological:     General: No focal deficit present.     Mental Status: He is alert and oriented to person, place, and time. Mental status is at baseline.  Psychiatric:        Mood and Affect: Mood normal.        Behavior: Behavior normal.        Thought Content: Thought content normal.        Judgment: Judgment normal.  No results found for any visits on 11/23/21.  Assessment & Plan     Problem List Items Addressed This Visit       Cardiovascular and Mediastinum   Essential hypertension    Chronic, previous stable Now elevated- likely due to acute pain in R infraspinatus tendon Has been on Mobic for >5 months  Continue Benicar 40 mg          Musculoskeletal and Integument   Tendinitis of right infraspinatus tendon - Primary    Chronic, worsening Has been using Mobic to assist Denies mechanism of injury Worse with use of hands on computer Causes associated pins/needle sensation and numbness Would recommend use of muscle relaxant and massage        Relevant Medications   cyclobenzaprine (FLEXERIL) 10 MG tablet   meloxicam (MOBIC) 15 MG tablet   Other Relevant Orders   Ambulatory referral to Sports Medicine   Ambulatory referral to Orthopedic Surgery      Other   Morbid obesity ([place])    Chronic, stable Body mass index is 37.84 kg/m. Associated with HTN, depression, arthritis in multiple joints Discussed importance of healthy weight management Discussed diet and exercise       Return if symptoms worsen or fail to improve.     Vonna Kotyk, FNP, have reviewed all documentation for this visit. The documentation on 11/23/21 for the exam, diagnosis, procedures, and orders are all accurate and complete.  Gwyneth Sprout, [place] [phone] (phone) [phone] (fax)  [place]

## 2022-01-29 ENCOUNTER — Other Ambulatory Visit: Payer: Self-pay | Admitting: Family Medicine

## 2022-02-16 ENCOUNTER — Encounter: Payer: Self-pay | Admitting: Family Medicine

## 2022-02-16 ENCOUNTER — Other Ambulatory Visit: Payer: Self-pay | Admitting: Family Medicine

## 2022-02-16 DIAGNOSIS — R350 Frequency of micturition: Secondary | ICD-10-CM

## 2022-02-16 DIAGNOSIS — N411 Chronic prostatitis: Secondary | ICD-10-CM

## 2022-02-16 MED ORDER — TADALAFIL 5 MG PO TABS: 5.0000 mg | ORAL_TABLET | Freq: Every day | ORAL | 1 refills | Status: DC

## 2022-02-16 NOTE — Progress Notes (Unsigned)
Established patient visit   Patient: Kyle Pollard   DOB: December 11, 1960   61 y.o. Male  MRN: [id number] Visit Date: 02/23/2022  Today's healthcare provider: Gwyneth Sprout, FNP  RE Introduced to nurse practitioner role and practice setting.  All questions answered.  Discussed provider/patient relationship and expectations.  I,Kyle Pollard J Kyle Pollard,acting as a scribe for Gwyneth Sprout, FNP.,have documented all relevant documentation on the behalf of Gwyneth Sprout, FNP,as directed by  Gwyneth Sprout, FNP while in the presence of Gwyneth Sprout, FNP.   Chief Complaint  Patient presents with   Annual Exam   Subjective    HPI   Medications: Outpatient Medications Prior to Visit  Medication Sig   FLUoxetine (PROZAC) 20 MG capsule TAKE ONE CAPSULE BY MOUTH DAILY   meloxicam (MOBIC) 15 MG tablet Take 1 tablet (15 mg total) by mouth daily.   tadalafil (CIALIS) 5 MG tablet Take 1 tablet (5 mg total) by mouth daily.   [DISCONTINUED] olmesartan (BENICAR) 40 MG tablet Take 1 tablet (40 mg total) by mouth daily.   [DISCONTINUED] cyclobenzaprine (FLEXERIL) 10 MG tablet Take 1 tablet (10 mg total) by mouth at bedtime.   No facility-administered medications prior to visit.    Review of Systems  Genitourinary:  Positive for difficulty urinating and frequency.  Musculoskeletal:  Positive for myalgias, neck pain and neck stiffness.    Last CBC Lab Results  Component Value Date   WBC 4.7 02/19/2021   HGB 15.5 02/19/2021   HCT 46.6 02/19/2021   MCV 92 02/19/2021   MCH 30.5 02/19/2021   RDW 12.7 02/19/2021   PLT 221 38[birth year]87[birth year]1959   Last metabolic panel Lab Results  Component Value Date   GLUCOSE 108 (H) 02/19/2021   NA 138 02/19/2021   K 5.2 02/19/2021   CL 100 02/19/2021   CO2 23 02/19/2021   BUN 15 02/19/2021   CREATININE 0.94 02/19/2021   EGFR 92 02/19/2021   CALCIUM 9.2 02/19/2021   PROT 7.0 02/19/2021   ALBUMIN 4.4 02/19/2021   LABGLOB 2.6 02/19/2021   AGRATIO 1.7 02/19/2021    BILITOT 0.5 02/19/2021   ALKPHOS 59 02/19/2021   AST 19 02/19/2021   ALT 22 02/19/2021   Last lipids Lab Results  Component Value Date   CHOL 189 02/19/2021   HDL 41 02/19/2021   LDLCALC 121 (H) 02/19/2021   TRIG 149 02/19/2021   CHOLHDL 4.6 02/19/2021   Last hemoglobin A1c Lab Results  Component Value Date   HGBA1C 6.0 (H) 02/13/2020   Last thyroid functions Lab Results  Component Value Date   TSH 2.420 02/19/2021   Last vitamin D No results found for: "25OHVITD2", "25OHVITD3", "VD25OH" Last vitamin B12 and Folate No results found for: "VITAMINB12", "FOLATE"     Objective    BP 120/75 (BP Location: Left Arm, Patient Position: Sitting, Cuff Size: Large)   Pulse 61   Temp 97.7 F (36.5 C) (Oral)   Resp 16   Ht 6' (1.829 m)   Wt 265 lb (120.2 kg)   SpO2 98%   BMI 35.94 kg/m  BP Readings from Last 3 Encounters:  02/23/22 120/75  11/23/21 (!) 147/85  09/09/21 128/74   Wt Readings from Last 3 Encounters:  02/23/22 265 lb (120.2 kg)  11/23/21 263 lb 11.2 oz (119.6 kg)  09/09/21 260 lb (117.9 kg)   SpO2 Readings from Last 3 Encounters:  02/23/22 98%  11/23/21 98%  09/09/21 97%  Physical Exam Vitals and nursing note reviewed.  Constitutional:      General: He is awake. He is not in acute distress.    Appearance: Normal appearance. He is well-developed and well-groomed. He is obese. He is not ill-appearing, toxic-appearing or diaphoretic.  HENT:     Head: Normocephalic and atraumatic.     Jaw: There is normal jaw occlusion. No trismus, tenderness, swelling or pain on movement.     Salivary Glands: Right salivary gland is not diffusely enlarged or tender. Left salivary gland is not diffusely enlarged or tender.     Right Ear: Hearing, tympanic membrane, ear canal and external ear normal. There is no impacted cerumen.     Left Ear: Hearing, tympanic membrane, ear canal and external ear normal. There is no impacted cerumen.     Nose: Nose normal. No  congestion or rhinorrhea.     Right Turbinates: Not enlarged, swollen or pale.     Left Turbinates: Not enlarged, swollen or pale.     Right Sinus: No maxillary sinus tenderness or frontal sinus tenderness.     Left Sinus: No maxillary sinus tenderness or frontal sinus tenderness.     Mouth/Throat:     Lips: Pink.     Mouth: Mucous membranes are moist. No injury, lacerations, oral lesions or angioedema.     Pharynx: Oropharynx is clear. Uvula midline. No pharyngeal swelling, oropharyngeal exudate or posterior oropharyngeal erythema.     Tonsils: No tonsillar exudate or tonsillar abscesses.  Eyes:     General: Lids are normal. Vision grossly intact. Gaze aligned appropriately.        Right eye: No discharge.        Left eye: No discharge.     Extraocular Movements: Extraocular movements intact.     Conjunctiva/sclera: Conjunctivae normal.     Pupils: Pupils are equal, round, and reactive to light.  Neck:     Thyroid: No thyroid mass, thyromegaly or thyroid tenderness.     Vascular: No carotid bruit.     Trachea: Trachea normal. No tracheal tenderness.  Cardiovascular:     Rate and Rhythm: Normal rate and regular rhythm.     Pulses: Normal pulses.          Carotid pulses are 2+ on the right side and 2+ on the left side.      Radial pulses are 2+ on the right side and 2+ on the left side.       Femoral pulses are 2+ on the right side and 2+ on the left side.      Popliteal pulses are 2+ on the right side and 2+ on the left side.       Dorsalis pedis pulses are 2+ on the right side and 2+ on the left side.       Posterior tibial pulses are 2+ on the right side and 2+ on the left side.     Heart sounds: Normal heart sounds, S1 normal and S2 normal. No murmur heard.    No friction rub. No gallop.  Pulmonary:     Effort: Pulmonary effort is normal. No respiratory distress.     Breath sounds: Normal breath sounds and air entry. No stridor. No wheezing, rhonchi or rales.  Chest:     Chest  wall: No tenderness.  Abdominal:     General: Abdomen is flat. Bowel sounds are normal. There is no distension.     Palpations: Abdomen is soft. There is no mass.  Tenderness: There is no abdominal tenderness. There is no guarding or rebound.     Hernia: No hernia is present.  Genitourinary:    Comments: Exam deferred; denies complaints Musculoskeletal:        General: Tenderness present. No swelling, deformity or signs of injury. Normal range of motion.     Cervical back: Normal range of motion and neck supple. No rigidity or tenderness.     Right lower leg: Edema present.     Left lower leg: Edema present.  Lymphadenopathy:     Cervical: No cervical adenopathy.     Right cervical: No superficial, deep or posterior cervical adenopathy.    Left cervical: No superficial, deep or posterior cervical adenopathy.  Skin:    General: Skin is warm and dry.     Capillary Refill: Capillary refill takes less than 2 seconds.     Coloration: Skin is not jaundiced or pale.     Findings: No bruising, erythema, lesion or rash.  Neurological:     General: No focal deficit present.     Mental Status: He is alert and oriented to person, place, and time. Mental status is at baseline.     GCS: GCS eye subscore is 4. GCS verbal subscore is 5. GCS motor subscore is 6.     Sensory: Sensation is intact. No sensory deficit.     Motor: Motor function is intact. No weakness.     Coordination: Coordination is intact.     Gait: Gait is intact.  Psychiatric:        Attention and Perception: Attention and perception normal.        Mood and Affect: Mood and affect normal.        Speech: Speech normal.        Behavior: Behavior normal. Behavior is cooperative.        Thought Content: Thought content normal.        Cognition and Memory: Cognition normal.        Judgment: Judgment normal.      No results found for any visits on 02/23/22.  Assessment & Plan     Problem List Items Addressed This Visit        Cardiovascular and Mediastinum   Essential hypertension    Chronic, stable; at goal <130/<80 Denies CP Denies SOB/ DOE; has noted fatigue d/t high temps Denies low blood pressure/hypotension Denies vision changes <130/<80 +1 pre tib LE Edema noted on exam Continue medication, Benicar 40 mg QD Denies side effects Seek emergent care if you develop chest pain or chest pressure       Relevant Medications   olmesartan (BENICAR) 40 MG tablet   Other Relevant Orders   Comprehensive Metabolic Panel (CMET)   CBC with Differential/Platelet   Lipid panel     Musculoskeletal and Integument   Tendinitis of right infraspinatus tendon    Chronic, followed by Ortho Failed flexeril 10 mg qHS Has been using Mobic 15 mg QD or OTC Ibuprofen PRN, +OTC voltaren Has received injections- next step MRI; patient concerned about cost Has been doing stretches, using ice, and trying to not sleep on arm        Other   Annual physical exam - Primary    UTD on vision Due for dental Things to do to keep yourself healthy  - Exercise at least 30-45 minutes a day, 3-4 days a week.  - Eat a low-fat diet with lots of fruits and vegetables, up to 7-9 servings per  day.  - Seatbelts can save your life. Wear them always.  - Smoke detectors on every level of your home, check batteries every year.  - Eye Doctor - have an eye exam every 1-2 years  - Safe sex - if you may be exposed to STDs, use a condom.  - Alcohol -  If you drink, do it moderately, less than 2 drinks per day.  - [place]. Choose someone to speak for you if you are not able.  - Depression is common in our stressful world.If you're feeling down or losing interest in things you normally enjoy, please come in for a visit.  - Violence - If anyone is threatening or hurting you, please call immediately.        Relevant Orders   Comprehensive Metabolic Panel (CMET)   CBC with Differential/Platelet   TSH + free T4    Hemoglobin A1c   PSA   Lipid panel   Fatigue due to exposure    Complaint of excess fatigue; denies SOB/DOE/CP, due to heat Reports good PO intake, limited suspicion for dehydration given he is not on a diuretic Pt request for labs- will check Vit B6, B12, D in addition to testosterone; routine labs will include CBC, CMP, TSH and A1c Pt declines referral to cardiology at this time Trace/+1 pitting edema noted on pre-tibs; chronic per pt report and does not worsen with activity in heat      Relevant Orders   Testosterone,Free and Total   Vitamin D (25 hydroxy)   B12 and Folate Panel   Vitamin B6   Prediabetes    Chronic, previously stable with diet/exercise  Repeat A1c Continue to recommend balanced, lower carb meals. Smaller meal size, adding snacks. Choosing water as drink of choice and increasing purposeful exercise.       Relevant Orders   Hemoglobin A1c   Screening for prostate cancer    Chronic complaints of prostatitis; stable with use of 5 mg Cialis daily Check PSA; defer DRE      Relevant Orders   PSA     Return in about 6 months (around 08/25/2022) for chonic disease management.      Kyle Kotyk, FNP, have reviewed all documentation for this visit. The documentation on 02/23/22 for the exam, diagnosis, procedures, and orders are all accurate and complete.    Gwyneth Sprout, [place] [phone] (phone) [phone] (fax)  [place]

## 2022-02-23 ENCOUNTER — Encounter: Payer: Self-pay | Admitting: Family Medicine

## 2022-02-23 ENCOUNTER — Ambulatory Visit (INDEPENDENT_AMBULATORY_CARE_PROVIDER_SITE_OTHER): Payer: 59 | Admitting: Family Medicine

## 2022-02-23 VITALS — BP 120/75 | HR 61 | Temp 97.7°F | Resp 16 | Ht 72.0 in | Wt 265.0 lb

## 2022-02-23 DIAGNOSIS — R7303 Prediabetes: Secondary | ICD-10-CM | POA: Diagnosis not present

## 2022-02-23 DIAGNOSIS — T732XXA Exhaustion due to exposure, initial encounter: Secondary | ICD-10-CM | POA: Insufficient documentation

## 2022-02-23 DIAGNOSIS — I10 Essential (primary) hypertension: Secondary | ICD-10-CM | POA: Diagnosis not present

## 2022-02-23 DIAGNOSIS — Z0000 Encounter for general adult medical examination without abnormal findings: Secondary | ICD-10-CM | POA: Insufficient documentation

## 2022-02-23 DIAGNOSIS — Z125 Encounter for screening for malignant neoplasm of prostate: Secondary | ICD-10-CM

## 2022-02-23 DIAGNOSIS — M7581 Other shoulder lesions, right shoulder: Secondary | ICD-10-CM

## 2022-02-23 MED ORDER — OLMESARTAN MEDOXOMIL 40 MG PO TABS: 40.0000 mg | ORAL_TABLET | Freq: Every day | ORAL | 1 refills | Status: DC

## 2022-02-23 NOTE — Assessment & Plan Note (Signed)
Chronic complaints of prostatitis; stable with use of 5 mg Cialis daily Check PSA; defer DRE

## 2022-02-23 NOTE — Assessment & Plan Note (Signed)
Chronic, previously stable with diet/exercise Repeat A1c Continue to recommend balanced, lower carb meals. Smaller meal size, adding snacks. Choosing water as drink of choice and increasing purposeful exercise.  

## 2022-02-23 NOTE — Assessment & Plan Note (Signed)
UTD on vision Due for dental Things to do to keep yourself healthy  - Exercise at least 30-45 minutes a day, 3-4 days a week.  - Eat a low-fat diet with lots of fruits and vegetables, up to 7-9 servings per day.  - Seatbelts can save your life. Wear them always.  - Smoke detectors on every level of your home, check batteries every year.  - Eye Doctor - have an eye exam every 1-2 years  - Safe sex - if you may be exposed to STDs, use a condom.  - Alcohol -  If you drink, do it moderately, less than 2 drinks per day.  - [place]. Choose someone to speak for you if you are not able.  - Depression is common in our stressful world.If you're feeling down or losing interest in things you normally enjoy, please come in for a visit.  - Violence - If anyone is threatening or hurting you, please call immediately.

## 2022-02-23 NOTE — Assessment & Plan Note (Signed)
Complaint of excess fatigue; denies SOB/DOE/CP, due to heat Reports good PO intake, limited suspicion for dehydration given he is not on a diuretic Pt request for labs- will check Vit B6, B12, D in addition to testosterone; routine labs will include CBC, CMP, TSH and A1c Pt declines referral to cardiology at this time Trace/+1 pitting edema noted on pre-tibs; chronic per pt report and does not worsen with activity in heat

## 2022-02-23 NOTE — Assessment & Plan Note (Signed)
Chronic, stable; at goal <130/<80 Denies CP Denies SOB/ DOE; has noted fatigue d/t high temps Denies low blood pressure/hypotension Denies vision changes <130/<80 +1 pre tib LE Edema noted on exam Continue medication, Benicar 40 mg QD Denies side effects Seek emergent care if you develop chest pain or chest pressure

## 2022-02-23 NOTE — Assessment & Plan Note (Signed)
Chronic, followed by Ortho Failed flexeril 10 mg qHS Has been using Mobic 15 mg QD or OTC Ibuprofen PRN, +OTC voltaren Has received injections- next step MRI; patient concerned about cost Has been doing stretches, using ice, and trying to not sleep on arm

## 2022-02-24 NOTE — Progress Notes (Signed)
Blood chemistry unremarkable; A1c slightly improved; Cholesterol shows elevation in fats in blood. The 10-year ASCVD risk score (Arnett DK, et al., 2019) is: 12.8%   Values used to calculate the score:     Age: 61 years     Sex: Male     Is Non-Hispanic African American: No     Diabetic: No     Tobacco smoker: No     Systolic Blood Pressure: 720 mmHg     Is BP treated: Yes     HDL Cholesterol: 41 mg/dL     Total Cholesterol: 199 mg/dL Heart attack and stroke risk is 13% estimated within the next 10 years which is moderate-high. Recommend starting Crestor 10 mg to assist in risk reduction of heart attack/stroke. Please let us know if you would like to start on this medication.   Vit D is low. Would recommend high dose Rx strength for 6 months with repeat labs following. Please let us know if this is OK.  Normal cell count.  Normal PSA.  Testosterone is stable at 390.  Vit B-12 and folate are normal.  Normal thyroid.  B6 is pending.  Gwyneth Sprout, [address] [place], [place] [address] [phone] (phone) [phone] (fax) [place]

## 2022-02-25 NOTE — Progress Notes (Signed)
Normal B6   Kyle Pollard, [place] [address] [place], [place] [address] [phone] (phone) [phone] (fax) [place]

## 2022-03-02 LAB — CBC WITH DIFFERENTIAL/PLATELET
Basophils Absolute: 0 10*3/uL (ref 0.0–0.2)
Basos: 1 %
EOS (ABSOLUTE): 0 10*3/uL (ref 0.0–0.4)
Eos: 0 %
Hematocrit: 41.7 % (ref 37.5–51.0)
Hemoglobin: 14.8 g/dL (ref 13.0–17.7)
Immature Grans (Abs): 0 10*3/uL (ref 0.0–0.1)
Immature Granulocytes: 0 %
Lymphocytes Absolute: 1.8 10*3/uL (ref 0.7–3.1)
Lymphs: 44 %
MCH: 31.6 pg (ref 26.6–33.0)
MCHC: 35.5 g/dL (ref 31.5–35.7)
MCV: 89 fL (ref 79–97)
Monocytes Absolute: 0.5 10*3/uL (ref 0.1–0.9)
Monocytes: 13 %
Neutrophils Absolute: 1.7 10*3/uL (ref 1.4–7.0)
Neutrophils: 42 %
Platelets: 222 10*3/uL (ref 150–450)
RBC: 4.68 x10E6/uL (ref 4.14–5.80)
RDW: 13.6 % (ref 11.6–15.4)
WBC: 4.1 10*3/uL (ref 3.4–10.8)

## 2022-03-02 LAB — COMPREHENSIVE METABOLIC PANEL
ALT: 27 IU/L (ref 0–44)
AST: 20 IU/L (ref 0–40)
Albumin/Globulin Ratio: 1.7 (ref 1.2–2.2)
Albumin: 4.3 g/dL (ref 3.9–4.9)
Alkaline Phosphatase: 50 IU/L (ref 44–121)
BUN/Creatinine Ratio: 26 — ABNORMAL HIGH (ref 10–24)
BUN: 17 mg/dL (ref 8–27)
Bilirubin Total: 0.5 mg/dL (ref 0.0–1.2)
CO2: 22 mmol/L (ref 20–29)
Calcium: 9 mg/dL (ref 8.6–10.2)
Chloride: 103 mmol/L (ref 96–106)
Creatinine, Ser: 0.66 mg/dL — ABNORMAL LOW (ref 0.76–1.27)
Globulin, Total: 2.5 g/dL (ref 1.5–4.5)
Glucose: 100 mg/dL — ABNORMAL HIGH (ref 70–99)
Potassium: 4.1 mmol/L (ref 3.5–5.2)
Sodium: 139 mmol/L (ref 134–144)
Total Protein: 6.8 g/dL (ref 6.0–8.5)
eGFR: 105 mL/min/{1.73_m2} (ref >59)

## 2022-03-02 LAB — LIPID PANEL
Chol/HDL Ratio: 4.9 ratio (ref 0.0–5.0)
Cholesterol, Total: 199 mg/dL (ref 100–199)
HDL: 41 mg/dL (ref >39)
LDL Chol Calc (NIH): 124 mg/dL — ABNORMAL HIGH (ref 0–99)
Triglycerides: 190 mg/dL — ABNORMAL HIGH (ref 0–149)
VLDL Cholesterol Cal: 34 mg/dL (ref 5–40)

## 2022-03-02 LAB — PSA: Prostate Specific Ag, Serum: 1.4 ng/mL (ref 0.0–4.0)

## 2022-03-02 LAB — TESTOSTERONE,FREE AND TOTAL
Testosterone, Free: 5.8 pg/mL — ABNORMAL LOW (ref 6.6–18.1)
Testosterone: 390 ng/dL (ref 264–916)

## 2022-03-02 LAB — TSH+FREE T4
Free T4: 1.26 ng/dL (ref 0.82–1.77)
TSH: 3.25 u[IU]/mL (ref 0.450–4.500)

## 2022-03-02 LAB — HEMOGLOBIN A1C
Est. average glucose Bld gHb Est-mCnc: 123 mg/dL
Hgb A1c MFr Bld: 5.9 % — ABNORMAL HIGH (ref 4.8–5.6)

## 2022-03-02 LAB — VITAMIN D 25 HYDROXY (VIT D DEFICIENCY, FRACTURES): Vit D, 25-Hydroxy: 23.5 ng/mL — ABNORMAL LOW (ref 30.0–100.0)

## 2022-03-02 LAB — VITAMIN B6: Vitamin B6: 51 ug/L (ref 3.4–65.2)

## 2022-03-02 LAB — B12 AND FOLATE PANEL
Folate: >20 ng/mL (ref >3.0)
Vitamin B-12: 478 pg/mL (ref 232–1245)

## 2022-05-06 ENCOUNTER — Ambulatory Visit (INDEPENDENT_AMBULATORY_CARE_PROVIDER_SITE_OTHER): Payer: 59 | Admitting: Family Medicine

## 2022-05-06 DIAGNOSIS — Z23 Encounter for immunization: Secondary | ICD-10-CM

## 2022-05-23 ENCOUNTER — Encounter: Payer: Self-pay | Admitting: Family Medicine

## 2022-05-23 DIAGNOSIS — N411 Chronic prostatitis: Secondary | ICD-10-CM

## 2022-05-23 DIAGNOSIS — R350 Frequency of micturition: Secondary | ICD-10-CM

## 2022-05-25 MED ORDER — TADALAFIL 5 MG PO TABS: 5.0000 mg | ORAL_TABLET | Freq: Every day | ORAL | 1 refills | Status: DC

## 2022-06-15 ENCOUNTER — Other Ambulatory Visit: Payer: Self-pay | Admitting: Family Medicine

## 2022-06-15 DIAGNOSIS — M7581 Other shoulder lesions, right shoulder: Secondary | ICD-10-CM

## 2022-06-15 NOTE — Telephone Encounter (Signed)
Requested Prescriptions  Pending Prescriptions Disp Refills   meloxicam (MOBIC) 15 MG tablet [Pharmacy Med Name: MELOXICAM 15 MG TABLET] 90 tablet 1    Sig: TAKE 1 TABLET BY MOUTH DAILY     Analgesics:  COX2 Inhibitors Failed - 06/15/2022  6:21 AM      Failed - Manual Review: Labs are only required if the patient has taken medication for more than 8 weeks.      Failed - Cr in normal range and within 360 days    Creatinine, Ser  Date Value Ref Range Status  02/23/2022 0.66 (L) 0.76 - 1.27 mg/dL Final         Passed - HGB in normal range and within 360 days    Hemoglobin  Date Value Ref Range Status  02/23/2022 14.8 13.0 - 17.7 g/dL Final         Passed - HCT in normal range and within 360 days    Hematocrit  Date Value Ref Range Status  02/23/2022 41.7 37.5 - 51.0 % Final         Passed - AST in normal range and within 360 days    AST  Date Value Ref Range Status  02/23/2022 20 0 - 40 IU/L Final         Passed - ALT in normal range and within 360 days    ALT  Date Value Ref Range Status  02/23/2022 27 0 - 44 IU/L Final         Passed - eGFR is 30 or above and within 360 days    GFR calc Af Amer  Date Value Ref Range Status  02/13/2020 101 >59 mL/min/1.73 Final    Comment:    **[facility] currently reports eGFR in compliance with the current**   recommendations of the [employer]. [facility] will   update reporting as new guidelines are published from the NKF-ASN   Task force.    GFR calc non Af Amer  Date Value Ref Range Status  02/13/2020 87 >59 mL/min/1.73 Final   eGFR  Date Value Ref Range Status  02/23/2022 105 >59 mL/min/1.73 Final         Passed - Patient is not pregnant      Passed - Valid encounter within last 12 months    Recent Outpatient Visits           3 months ago Annual physical exam   [facility] Tally Joe T, FNP   6 months ago Tendinitis of right infraspinatus tendon   [facility] Gwyneth Sprout, FNP   9 months ago Essential hypertension   [facility] Gwyneth Sprout, FNP   1 year ago Annual physical exam   [place], PA-C   2 years ago Annual physical exam   [name], [place]

## 2022-06-28 DIAGNOSIS — M5412 Radiculopathy, cervical region: Secondary | ICD-10-CM | POA: Insufficient documentation

## 2022-07-28 ENCOUNTER — Other Ambulatory Visit: Payer: Self-pay | Admitting: Family Medicine

## 2022-08-20 ENCOUNTER — Other Ambulatory Visit: Payer: Self-pay | Admitting: Family Medicine

## 2022-08-20 DIAGNOSIS — I10 Essential (primary) hypertension: Secondary | ICD-10-CM

## 2022-08-22 ENCOUNTER — Encounter: Payer: Self-pay | Admitting: Family Medicine

## 2022-08-22 ENCOUNTER — Other Ambulatory Visit: Payer: Self-pay | Admitting: Family Medicine

## 2022-08-22 DIAGNOSIS — R350 Frequency of micturition: Secondary | ICD-10-CM

## 2022-08-22 DIAGNOSIS — N411 Chronic prostatitis: Secondary | ICD-10-CM

## 2022-08-22 MED ORDER — TADALAFIL 5 MG PO TABS: 5.0000 mg | ORAL_TABLET | Freq: Every day | ORAL | 1 refills | Status: DC

## 2023-02-02 ENCOUNTER — Other Ambulatory Visit: Payer: Self-pay | Admitting: Family Medicine

## 2023-02-03 NOTE — Telephone Encounter (Signed)
Requested Prescriptions  Pending Prescriptions Disp Refills   FLUoxetine (PROZAC) 20 MG capsule [Pharmacy Med Name: FLUoxetine HCL 20 MG CAPSULE] 90 capsule 0    Sig: TAKE 1 CAPSULE BY MOUTH DAILY     Psychiatry:  Antidepressants - SSRI Failed - 02/02/2023  6:21 AM      Failed - Valid encounter within last 6 months    Recent Outpatient Visits           11 months ago Annual physical exam   [facility] Merita Norton T, FNP   1 year ago Tendinitis of right infraspinatus tendon   [facility] Jacky Kindle, FNP   1 year ago Essential hypertension   [facility] Jacky Kindle, FNP   1 year ago Annual physical exam   [facility] Chrismon, Jodell Cipro, PA-C   2 years ago Annual physical exam   [facility] Joycelyn Man M, [place]              Passed - Completed PHQ-2 or PHQ-9 in the last 360 days

## 2023-02-14 ENCOUNTER — Other Ambulatory Visit: Payer: Self-pay | Admitting: Family Medicine

## 2023-02-14 DIAGNOSIS — I10 Essential (primary) hypertension: Secondary | ICD-10-CM

## 2023-02-15 ENCOUNTER — Encounter: Payer: Self-pay | Admitting: Family Medicine

## 2023-02-15 ENCOUNTER — Other Ambulatory Visit: Payer: Self-pay | Admitting: Family Medicine

## 2023-02-15 DIAGNOSIS — R350 Frequency of micturition: Secondary | ICD-10-CM

## 2023-02-15 DIAGNOSIS — N411 Chronic prostatitis: Secondary | ICD-10-CM

## 2023-02-15 MED ORDER — TADALAFIL 5 MG PO TABS: 5.0000 mg | ORAL_TABLET | Freq: Every day | ORAL | 1 refills | Status: DC

## 2023-03-08 ENCOUNTER — Ambulatory Visit (INDEPENDENT_AMBULATORY_CARE_PROVIDER_SITE_OTHER): Payer: 59 | Admitting: Family Medicine

## 2023-03-08 ENCOUNTER — Encounter: Payer: Self-pay | Admitting: Family Medicine

## 2023-03-08 VITALS — BP 137/78 | HR 48 | Ht 72.0 in | Wt 239.0 lb

## 2023-03-08 DIAGNOSIS — N411 Chronic prostatitis: Secondary | ICD-10-CM

## 2023-03-08 DIAGNOSIS — F39 Unspecified mood [affective] disorder: Secondary | ICD-10-CM

## 2023-03-08 DIAGNOSIS — I10 Essential (primary) hypertension: Secondary | ICD-10-CM

## 2023-03-08 DIAGNOSIS — M150 Primary generalized (osteo)arthritis: Secondary | ICD-10-CM

## 2023-03-08 DIAGNOSIS — Z0001 Encounter for general adult medical examination with abnormal findings: Secondary | ICD-10-CM

## 2023-03-08 DIAGNOSIS — Z6832 Body mass index (BMI) 32.0-32.9, adult: Secondary | ICD-10-CM

## 2023-03-08 DIAGNOSIS — M159 Polyosteoarthritis, unspecified: Secondary | ICD-10-CM

## 2023-03-08 DIAGNOSIS — Z125 Encounter for screening for malignant neoplasm of prostate: Secondary | ICD-10-CM

## 2023-03-08 DIAGNOSIS — Z23 Encounter for immunization: Secondary | ICD-10-CM

## 2023-03-08 DIAGNOSIS — E66811 Other obesity due to excess calories: Secondary | ICD-10-CM

## 2023-03-08 DIAGNOSIS — R7303 Prediabetes: Secondary | ICD-10-CM

## 2023-03-08 DIAGNOSIS — Z1211 Encounter for screening for malignant neoplasm of colon: Secondary | ICD-10-CM | POA: Insufficient documentation

## 2023-03-08 DIAGNOSIS — E559 Vitamin D deficiency, unspecified: Secondary | ICD-10-CM

## 2023-03-08 DIAGNOSIS — R7989 Other specified abnormal findings of blood chemistry: Secondary | ICD-10-CM | POA: Insufficient documentation

## 2023-03-08 DIAGNOSIS — E6609 Other obesity due to excess calories: Secondary | ICD-10-CM

## 2023-03-08 DIAGNOSIS — Z0000 Encounter for general adult medical examination without abnormal findings: Secondary | ICD-10-CM

## 2023-03-08 DIAGNOSIS — Z720 Tobacco use: Secondary | ICD-10-CM | POA: Insufficient documentation

## 2023-03-08 MED ORDER — MELOXICAM 7.5 MG PO TABS
7.5000 mg | ORAL_TABLET | Freq: Every day | ORAL | 11 refills | Status: DC
Start: 2023-03-08 — End: 2024-03-20

## 2023-03-08 MED ORDER — FLUOXETINE HCL 20 MG PO CAPS: 20.0000 mg | ORAL_CAPSULE | Freq: Every day | ORAL | 3 refills | Status: DC

## 2023-03-08 NOTE — Progress Notes (Unsigned)
Complete physical exam  Patient: Kyle Pollard   DOB: 09/14/1960   62 y.o. Male  MRN: [id number] Visit Date: 03/08/2023  Today's healthcare provider: Jacky Kindle, FNP  Re-introduced to nurse practitioner role and practice setting.  All questions answered.  Discussed provider/patient relationship and expectations.  Chief Complaint  Patient presents with   Annual Exam    Would like to go back o meloxicam    Subjective    Kyle Pollard is a 62 y.o. male who presents today for a complete physical exam.  He reports consuming a general diet. Home exercise routine includes running 1.5 miles multiple days/week. He generally feels well. He reports sleeping well. He does have additional problems to discuss today.   HPI HPI     Annual Exam    Additional comments: Would like to go back o meloxicam       Last edited by Rolly Salter, CMA on 03/08/2023 10:28 AM.      Past Medical History:  Diagnosis Date   Allergy    Hypertension    Past Surgical History:  Procedure Laterality Date   Broken Leg  1992   COLONOSCOPY WITH PROPOFOL N/A 03/09/2020   Procedure: COLONOSCOPY WITH PROPOFOL;  Surgeon: Wyline Mood, MD;  Location: [facility] ENDOSCOPY;  Service: Gastroenterology;  Laterality: N/A;  Went to [facility] today 9/10 for C-19 test about noon   VASECTOMY     Social History   Socioeconomic History   Marital status: Married    Spouse name: Not on file   Number of children: Not on file   Years of education: Not on file   Highest education level: Associate degree: occupational, Scientist, product/process development, or vocational program  Occupational History   Not on file  Tobacco Use   Smoking status: Never   Smokeless tobacco: Current   Tobacco comments:    Chewing tobacco- 2 cans/week; buys in bulk  Vaping Use   Vaping status: Never Used  Substance and Sexual Activity   Alcohol use: Yes   Drug use: No   Sexual activity: Yes  Other Topics Concern   Not on file  Social History Narrative   Not on  file   Social Determinants of Health   Financial Resource Strain: Low Risk  (03/06/2023)   Overall Financial Resource Strain (CARDIA)    Difficulty of Paying Living Expenses: Not hard at all  Food Insecurity: No Food Insecurity (03/06/2023)   Hunger Vital Sign    Worried About Running Out of Food in the Last Year: Never true    Ran Out of Food in the Last Year: Never true  Transportation Needs: No Transportation Needs (03/06/2023)   PRAPARE - Administrator, Civil Service (Medical): No    Lack of Transportation (Non-Medical): No  Physical Activity: Sufficiently Active (03/06/2023)   Exercise Vital Sign    Days of Exercise per Week: 7 days    Minutes of Exercise per Session: 30 min  Stress: No Stress Concern Present (03/06/2023)   [employer] of Occupational Health - Occupational Stress Questionnaire    Feeling of Stress : Only a little  Social Connections: Moderately Integrated (03/06/2023)   Social Connection and Isolation Panel [NHANES]    Frequency of Communication with Friends and Family: More than three times a week    Frequency of Social Gatherings with Friends and Family: Twice a week    Attends Religious Services: More than 4 times per year    Active Member of  Clubs or Organizations: No    Attends Engineer, structural: Not on file    Marital Status: Married  Catering manager Violence: Not on file   Family Status  Relation Name Status   Mother  Deceased   Father  Deceased   Neg Hx  (Not Specified)  No partnership data on file   Family History  Problem Relation Age of Onset   Dementia Mother    Prostate cancer Neg Hx    Bladder Cancer Neg Hx    Kidney cancer Neg Hx    Allergies  Allergen Reactions   Penicillins Rash    Patient Care Team: Jacky Kindle, FNP as PCP - General (Family Medicine)   Medications: Outpatient Medications Prior to Visit  Medication Sig   olmesartan (BENICAR) 40 MG tablet TAKE 1 TABLET BY MOUTH DAILY   tadalafil  (CIALIS) 5 MG tablet Take 1 tablet (5 mg total) by mouth daily.   [DISCONTINUED] FLUoxetine (PROZAC) 20 MG capsule TAKE 1 CAPSULE BY MOUTH DAILY   [DISCONTINUED] meloxicam (MOBIC) 15 MG tablet TAKE 1 TABLET BY MOUTH DAILY   No facility-administered medications prior to visit.   Review of Systems  Objective    BP 137/78   Pulse (!) 48   Ht 6' (1.829 m)   Wt 239 lb (108.4 kg)   SpO2 98%   BMI 32.41 kg/m   Physical Exam Vitals and nursing note reviewed.  Constitutional:      General: He is awake. He is not in acute distress.    Appearance: Normal appearance. He is well-developed and well-groomed. He is obese. He is not ill-appearing, toxic-appearing or diaphoretic.  HENT:     Head: Normocephalic and atraumatic.     Jaw: There is normal jaw occlusion. No trismus, tenderness, swelling or pain on movement.     Salivary Glands: Right salivary gland is not diffusely enlarged or tender. Left salivary gland is not diffusely enlarged or tender.     Right Ear: Hearing, tympanic membrane, ear canal and external ear normal. There is no impacted cerumen.     Left Ear: Hearing, tympanic membrane, ear canal and external ear normal. There is no impacted cerumen.     Nose: Nose normal. No congestion or rhinorrhea.     Right Turbinates: Not enlarged, swollen or pale.     Left Turbinates: Not enlarged, swollen or pale.     Right Sinus: No maxillary sinus tenderness or frontal sinus tenderness.     Left Sinus: No maxillary sinus tenderness or frontal sinus tenderness.     Mouth/Throat:     Lips: Pink.     Mouth: Mucous membranes are moist. No injury, lacerations, oral lesions or angioedema.     Pharynx: Oropharynx is clear. Uvula midline. No pharyngeal swelling, oropharyngeal exudate or posterior oropharyngeal erythema.     Tonsils: No tonsillar exudate or tonsillar abscesses.  Eyes:     General: Lids are normal. Vision grossly intact. Gaze aligned appropriately.        Right eye: No discharge.         Left eye: No discharge.     Extraocular Movements: Extraocular movements intact.     Conjunctiva/sclera: Conjunctivae normal.     Pupils: Pupils are equal, round, and reactive to light.  Neck:     Thyroid: No thyroid mass, thyromegaly or thyroid tenderness.     Vascular: No carotid bruit.     Trachea: Trachea normal. No tracheal tenderness.  Cardiovascular:  Rate and Rhythm: Regular rhythm. Bradycardia present.     Pulses: Normal pulses.          Carotid pulses are 2+ on the right side and 2+ on the left side.      Radial pulses are 2+ on the right side and 2+ on the left side.       Femoral pulses are 2+ on the right side and 2+ on the left side.      Popliteal pulses are 2+ on the right side and 2+ on the left side.       Dorsalis pedis pulses are 2+ on the right side and 2+ on the left side.       Posterior tibial pulses are 2+ on the right side and 2+ on the left side.     Heart sounds: Normal heart sounds, S1 normal and S2 normal. No murmur heard.    No friction rub. No gallop.  Pulmonary:     Effort: Pulmonary effort is normal. No respiratory distress.     Breath sounds: Normal breath sounds and air entry. No stridor. No wheezing, rhonchi or rales.  Chest:     Chest wall: No tenderness.  Abdominal:     General: Abdomen is flat. Bowel sounds are normal. There is no distension.     Palpations: Abdomen is soft. There is no mass.     Tenderness: There is no abdominal tenderness. There is no guarding or rebound.     Hernia: No hernia is present.  Genitourinary:    Comments: Exam deferred; chronic complaints of urinary frequency  Musculoskeletal:        General: No swelling, tenderness, deformity or signs of injury. Normal range of motion.     Cervical back: Normal range of motion and neck supple. No rigidity or tenderness.     Right lower leg: No edema.     Left lower leg: No edema.     Comments: Chronic pain in bilateral knees   Lymphadenopathy:     Cervical: No  cervical adenopathy.     Right cervical: No superficial, deep or posterior cervical adenopathy.    Left cervical: No superficial, deep or posterior cervical adenopathy.  Skin:    General: Skin is warm and dry.     Capillary Refill: Capillary refill takes less than 2 seconds.     Coloration: Skin is not jaundiced or pale.     Findings: No bruising, erythema, lesion or rash.  Neurological:     General: No focal deficit present.     Mental Status: He is alert and oriented to person, place, and time. Mental status is at baseline.     GCS: GCS eye subscore is 4. GCS verbal subscore is 5. GCS motor subscore is 6.     Sensory: Sensation is intact. No sensory deficit.     Motor: Motor function is intact. No weakness.     Coordination: Coordination is intact.     Gait: Gait is intact.  Psychiatric:        Attention and Perception: Attention and perception normal.        Mood and Affect: Mood and affect normal.        Speech: Speech normal.        Behavior: Behavior normal. Behavior is cooperative.        Thought Content: Thought content normal.        Cognition and Memory: Cognition normal.        Judgment: Judgment normal.  Last depression screening scores    03/08/2023   11:00 AM 02/23/2022    8:29 AM 11/23/2021    1:08 PM  PHQ 2/9 Scores  PHQ - 2 Score 0 0 0  PHQ- 9 Score  1 1   Last fall risk screening    03/08/2023   11:00 AM  Fall Risk   Falls in the past year? 0  Number falls in past yr: 0  Injury with Fall? 0   Last Audit-C alcohol use screening    03/06/2023    1:16 PM  Alcohol Use Disorder Test (AUDIT)  1. How often do you have a drink containing alcohol? 2  2. How many drinks containing alcohol do you have on a typical day when you are drinking? 1  3. How often do you have six or more drinks on one occasion? 0  AUDIT-C Score 3  4. How often during the last year have you found that you were not able to stop drinking once you had started? 0  5. How often during the  last year have you failed to do what was normally expected from you because of drinking? 0  6. How often during the last year have you needed a first drink in the morning to get yourself going after a heavy drinking session? 0  7. How often during the last year have you had a feeling of guilt of remorse after drinking? 0  8. How often during the last year have you been unable to remember what happened the night before because you had been drinking? 0  9. Have you or someone else been injured as a result of your drinking? 0  10. Has a relative or friend or a doctor or another health worker been concerned about your drinking or suggested you cut down? 0  Alcohol Use Disorder Identification Test Final Score (AUDIT) 3   A score of 3 or more in women, and 4 or more in men indicates increased risk for alcohol abuse, EXCEPT if all of the points are from question 1   No results found for any visits on 03/08/23.  Assessment & Plan    Routine Health Maintenance and Physical Exam  Exercise Activities and Dietary recommendations  Goals   None     Immunization History  Administered Date(s) Administered   Influenza,inj,Quad PF,6+ Mos 05/06/2022   Tdap 02/06/2019    Health Maintenance  Topic Date Due   Zoster Vaccines- Shingrix (1 of 2) Never done   COVID-19 Vaccine (1 - 2023-24 season) Never done   Colonoscopy  03/10/2023   INFLUENZA VACCINE  09/25/2023 (Originally 01/26/2023)   DTaP/Tdap/Td (2 - Td or Tdap) 02/05/2029   Hepatitis C Screening  Completed   HIV Screening  Completed   HPV VACCINES  Aged Out    Discussed health benefits of physical activity, and encouraged him to engage in regular exercise appropriate for his age and condition.  Problem List Items Addressed This Visit       Cardiovascular and Mediastinum   Essential hypertension    Chronic, borderline Repeat CBC, CMP, TSH Continues on benicar 40 mg      Relevant Orders   CBC with Differential/Platelet   Comprehensive  Metabolic Panel (CMET)   TSH     Musculoskeletal and Integument   Primary osteoarthritis involving multiple joints    Chronic, worse without NSAID Mobic 15 mg; will start at 7.5 mg. Pt encouraged to confirm OK to use with ortho Dr Noralyn Pick given  their recommendation to stop      Relevant Medications   meloxicam (MOBIC) 7.5 MG tablet     Genitourinary   Chronic prostatitis    Chronic, stable Continues on 5 mg cilasis       Relevant Orders   PSA     Other   Annual physical exam - Primary    UTD on vision; denies dental Things to do to keep yourself healthy  - Exercise at least 30-45 minutes a day, 3-4 days a week.  - Eat a low-fat diet with lots of fruits and vegetables, up to 7-9 servings per day.  - Seatbelts can save your life. Wear them always.  - Smoke detectors on every level of your home, check batteries every year.  - Eye Doctor - have an eye exam every 1-2 years  - Safe sex - if you may be exposed to STDs, use a condom.  - Alcohol -  If you drink, do it moderately, less than 2 drinks per day.  - Health Care Power of Attorney. Choose someone to speak for you if you are not able.  - Depression is common in our stressful world.If you're feeling down or losing interest in things you normally enjoy, please come in for a visit.  - Violence - If anyone is threatening or hurting you, please call immediately. Plans to switch to medicare in Spring when he turns 62 yo      Relevant Orders   Flu vaccine trivalent PF, 6mos and older(Flulaval,Afluria,Fluarix,Fluzone)   Avitaminosis D    Chronic, repeat labs given known OA      Relevant Orders   Vitamin D (25 hydroxy)   Chewing tobacco use    Chronic, unchanged Pt remains pre-contemplative at this time regarding cessation efforts       Class 1 obesity due to excess calories with serious comorbidity and body mass index (BMI) of 32.0 to 32.9 in adult    Improved; Body mass index is 32.41 kg/m. Discussed importance of  healthy weight management Discussed diet and exercise       Low testosterone    Chronic, reports low sex drive Repeat labs to assist      Relevant Orders   Testosterone,Free and Total   Mood disorder (HCC)    Chronic, hx of depression and anxiety Previously on prozac 20 mg; reports symptoms of anxiety are improved and depression is stable      Prediabetes    Chronic, stable Repeat A1c Continue to recommend balanced, lower carb meals. Smaller meal size, adding snacks. Choosing water as drink of choice and increasing purposeful exercise.,      Relevant Orders   Hemoglobin A1c   Lipid panel   Screen for colon cancer    Due for f/u colon cancer screening       Relevant Orders   Ambulatory referral to Gastroenterology   Screening for prostate cancer    Endorses LUTS; recommend PSA in place of DRE. If PSA is elevated for age, we will repeat; if PSA remains elevated pt will be referred to urology for DRE and next steps for best treatment.       Relevant Orders   PSA   Return if symptoms worsen or fail to improve.    Leilani Merl, FNP, have reviewed all documentation for this visit. The documentation on 03/08/23 for the exam, diagnosis, procedures, and orders are all accurate and complete.  Jacky Kindle, FNP  [facility]  [phone] (phone) [phone] (fax)  [facility]

## 2023-03-08 NOTE — Assessment & Plan Note (Signed)
Chronic, borderline Repeat CBC, CMP, TSH Continues on benicar 40 mg

## 2023-03-08 NOTE — Assessment & Plan Note (Signed)
Chronic, hx of depression and anxiety Previously on prozac 20 mg; reports symptoms of anxiety are improved and depression is stable

## 2023-03-08 NOTE — Assessment & Plan Note (Signed)
Endorses LUTS; recommend PSA in place of DRE. If PSA is elevated for age, we will repeat; if PSA remains elevated pt will be referred to urology for DRE and next steps for best treatment.  

## 2023-03-08 NOTE — Assessment & Plan Note (Signed)
Chronic, reports low sex drive Repeat labs to assist

## 2023-03-08 NOTE — Assessment & Plan Note (Signed)
UTD on vision; denies dental Things to do to keep yourself healthy  - Exercise at least 30-45 minutes a day, 3-4 days a week.  - Eat a low-fat diet with lots of fruits and vegetables, up to 7-9 servings per day.  - Seatbelts can save your life. Wear them always.  - Smoke detectors on every level of your home, check batteries every year.  - Eye Doctor - have an eye exam every 1-2 years  - Safe sex - if you may be exposed to STDs, use a condom.  - Alcohol -  If you drink, do it moderately, less than 2 drinks per day.  - Health Care Power of Attorney. Choose someone to speak for you if you are not able.  - Depression is common in our stressful world.If you're feeling down or losing interest in things you normally enjoy, please come in for a visit.  - Violence - If anyone is threatening or hurting you, please call immediately. Plans to switch to medicare in Spring when he turns 62 yo

## 2023-03-08 NOTE — Assessment & Plan Note (Signed)
Chronic, stable Continues on 5 mg cilasis

## 2023-03-08 NOTE — Assessment & Plan Note (Signed)
Chronic, worse without NSAID Mobic 15 mg; will start at 7.5 mg. Pt encouraged to confirm OK to use with ortho Dr Noralyn Pick given their recommendation to stop

## 2023-03-08 NOTE — Assessment & Plan Note (Signed)
Chronic, stable Repeat A1c Continue to recommend balanced, lower carb meals. Smaller meal size, adding snacks. Choosing water as drink of choice and increasing purposeful exercise.,

## 2023-03-08 NOTE — Assessment & Plan Note (Signed)
Improved; Body mass index is 32.41 kg/m. Discussed importance of healthy weight management Discussed diet and exercise

## 2023-03-08 NOTE — Assessment & Plan Note (Signed)
Chronic, unchanged Pt remains pre-contemplative at this time regarding cessation efforts

## 2023-03-08 NOTE — Assessment & Plan Note (Signed)
Due for f/u colon cancer screening

## 2023-03-08 NOTE — Assessment & Plan Note (Signed)
Chronic, repeat labs given known OA

## 2023-03-08 NOTE — Patient Instructions (Signed)
The CDC recommends two doses of Shingrix (the new shingles vaccine) separated by 2 to 6 months for adults age 64 years and older. I recommend checking with your insurance plan regarding coverage for this vaccine.    

## 2023-03-09 ENCOUNTER — Other Ambulatory Visit: Payer: Self-pay | Admitting: Family Medicine

## 2023-03-09 MED ORDER — ROSUVASTATIN CALCIUM 10 MG PO TABS: 10.0000 mg | ORAL_TABLET | Freq: Every day | ORAL | 3 refills | Status: DC

## 2023-03-10 ENCOUNTER — Other Ambulatory Visit: Payer: Self-pay | Admitting: Family Medicine

## 2023-03-10 ENCOUNTER — Telehealth: Payer: Self-pay

## 2023-03-10 ENCOUNTER — Encounter: Payer: Self-pay | Admitting: Family Medicine

## 2023-03-10 ENCOUNTER — Other Ambulatory Visit: Payer: Self-pay

## 2023-03-10 DIAGNOSIS — R339 Retention of urine, unspecified: Secondary | ICD-10-CM | POA: Insufficient documentation

## 2023-03-10 DIAGNOSIS — R200 Anesthesia of skin: Secondary | ICD-10-CM | POA: Insufficient documentation

## 2023-03-10 DIAGNOSIS — K219 Gastro-esophageal reflux disease without esophagitis: Secondary | ICD-10-CM | POA: Insufficient documentation

## 2023-03-10 DIAGNOSIS — Z86010 Personal history of colonic polyps: Secondary | ICD-10-CM

## 2023-03-10 DIAGNOSIS — S8290XA Unspecified fracture of unspecified lower leg, initial encounter for closed fracture: Secondary | ICD-10-CM | POA: Insufficient documentation

## 2023-03-10 MED ORDER — NA SULFATE-K SULFATE-MG SULF 17.5-3.13-1.6 GM/177ML PO SOLN: 1.0000 | Freq: Once | ORAL | 0 refills | Status: AC

## 2023-03-10 NOTE — Telephone Encounter (Signed)
Gastroenterology Pre-Procedure Review  Request Date: 05/01/23 Requesting Physician: Dr. Tobi Bastos  PATIENT REVIEW QUESTIONS: The patient responded to the following health history questions as indicated:    1. Are you having any GI issues? no 2. Do you have a personal history of Polyps? yes (Yes last colonoscopy 03/09/20 performed by Dr. Tobi Bastos recommended repeat in 3 years) 3. Do you have a family history of Colon Cancer or Polyps? no 4. Diabetes Mellitus? no 5. Joint replacements in the past 12 months?no 6. Major health problems in the past 3 months?no 7. Any artificial heart valves, MVP, or defibrillator?no    MEDICATIONS & ALLERGIES:    Patient reports the following regarding taking any anticoagulation/antiplatelet therapy:   Plavix, Coumadin, Eliquis, Xarelto, Lovenox, Pradaxa, Brilinta, or Effient? no Aspirin? no  Patient confirms/reports the following medications:  Current Outpatient Medications  Medication Sig Dispense Refill   FLUoxetine (PROZAC) 20 MG capsule Take 1 capsule (20 mg total) by mouth daily. 90 capsule 3   meloxicam (MOBIC) 7.5 MG tablet Take 1 tablet (7.5 mg total) by mouth daily. Confirm OK to resume with ortho prior to start 30 tablet 11   olmesartan (BENICAR) 40 MG tablet TAKE 1 TABLET BY MOUTH DAILY 90 tablet 1   rosuvastatin (CRESTOR) 10 MG tablet Take 1 tablet (10 mg total) by mouth daily. 90 tablet 3   tadalafil (CIALIS) 5 MG tablet Take 1 tablet (5 mg total) by mouth daily. 90 tablet 1   No current facility-administered medications for this visit.    Patient confirms/reports the following allergies:  Allergies  Allergen Reactions   Penicillins Rash    No orders of the defined types were placed in this encounter.   AUTHORIZATION INFORMATION Primary Insurance: 1D#: Group #:  Secondary Insurance: 1D#: Group #:  SCHEDULE INFORMATION: Date: 05/01/23 Time: Location: [facility]

## 2023-03-12 ENCOUNTER — Encounter: Payer: Self-pay | Admitting: Family Medicine

## 2023-03-12 DIAGNOSIS — R7989 Other specified abnormal findings of blood chemistry: Secondary | ICD-10-CM

## 2023-03-12 LAB — CBC WITH DIFFERENTIAL/PLATELET
Basophils Absolute: 0 10*3/uL (ref 0.0–0.2)
Basos: 1 %
EOS (ABSOLUTE): 0.1 10*3/uL (ref 0.0–0.4)
Eos: 3 %
Hematocrit: 42.4 % (ref 37.5–51.0)
Hemoglobin: 14.4 g/dL (ref 13.0–17.7)
Immature Grans (Abs): 0 10*3/uL (ref 0.0–0.1)
Immature Granulocytes: 0 %
Lymphocytes Absolute: 1.5 10*3/uL (ref 0.7–3.1)
Lymphs: 35 %
MCH: 29.9 pg (ref 26.6–33.0)
MCHC: 34 g/dL (ref 31.5–35.7)
MCV: 88 fL (ref 79–97)
Monocytes Absolute: 0.6 10*3/uL (ref 0.1–0.9)
Monocytes: 14 %
Neutrophils Absolute: 2 10*3/uL (ref 1.4–7.0)
Neutrophils: 47 %
Platelets: 214 10*3/uL (ref 150–450)
RBC: 4.81 x10E6/uL (ref 4.14–5.80)
RDW: 12.8 % (ref 11.6–15.4)
WBC: 4.2 10*3/uL (ref 3.4–10.8)

## 2023-03-12 LAB — PSA: Prostate Specific Ag, Serum: 1.1 ng/mL (ref 0.0–4.0)

## 2023-03-12 LAB — TESTOSTERONE,FREE AND TOTAL
Testosterone, Free: 5.5 pg/mL — ABNORMAL LOW (ref 6.6–18.1)
Testosterone: 551 ng/dL (ref 264–916)

## 2023-03-12 LAB — COMPREHENSIVE METABOLIC PANEL
ALT: 26 IU/L (ref 0–44)
AST: 18 IU/L (ref 0–40)
Albumin: 4.3 g/dL (ref 3.9–4.9)
Alkaline Phosphatase: 60 IU/L (ref 44–121)
BUN/Creatinine Ratio: 18 (ref 10–24)
BUN: 16 mg/dL (ref 8–27)
Bilirubin Total: 0.5 mg/dL (ref 0.0–1.2)
CO2: 23 mmol/L (ref 20–29)
Calcium: 9.3 mg/dL (ref 8.6–10.2)
Chloride: 103 mmol/L (ref 96–106)
Creatinine, Ser: 0.87 mg/dL (ref 0.76–1.27)
Globulin, Total: 2.6 g/dL (ref 1.5–4.5)
Glucose: 110 mg/dL — ABNORMAL HIGH (ref 70–99)
Potassium: 4.3 mmol/L (ref 3.5–5.2)
Sodium: 139 mmol/L (ref 134–144)
Total Protein: 6.9 g/dL (ref 6.0–8.5)
eGFR: 96 mL/min/{1.73_m2} (ref >59)

## 2023-03-12 LAB — LIPID PANEL
Chol/HDL Ratio: 3.4 ratio (ref 0.0–5.0)
Cholesterol, Total: 151 mg/dL (ref 100–199)
HDL: 45 mg/dL (ref >39)
LDL Chol Calc (NIH): 91 mg/dL (ref 0–99)
Triglycerides: 75 mg/dL (ref 0–149)
VLDL Cholesterol Cal: 15 mg/dL (ref 5–40)

## 2023-03-12 LAB — HEMOGLOBIN A1C
Est. average glucose Bld gHb Est-mCnc: 123 mg/dL
Hgb A1c MFr Bld: 5.9 % — ABNORMAL HIGH (ref 4.8–5.6)

## 2023-03-12 LAB — TSH: TSH: 2.1 u[IU]/mL (ref 0.450–4.500)

## 2023-03-12 LAB — VITAMIN D 25 HYDROXY (VIT D DEFICIENCY, FRACTURES): Vit D, 25-Hydroxy: 49.6 ng/mL (ref 30.0–100.0)

## 2023-04-24 ENCOUNTER — Telehealth: Payer: Self-pay

## 2023-04-24 NOTE — Telephone Encounter (Signed)
Patient has requested to cancel colonoscopy scheduled with Dr. Tobi Bastos on 05/01/23 due to insurance and out of pocket estimate.   Asked if his insurance will change next year and he said yes he would have Medicare.  Advised that he can call me back next year when it changes and I'll be happy to get him back scheduled. Vikki in Endo notified.  Thanks,  Marcelino Duster CMA

## 2023-04-26 ENCOUNTER — Encounter: Payer: Self-pay | Admitting: Urology

## 2023-04-26 ENCOUNTER — Encounter (INDEPENDENT_AMBULATORY_CARE_PROVIDER_SITE_OTHER): Payer: 59 | Admitting: Urology

## 2023-04-26 NOTE — Progress Notes (Signed)
Patient was referred for hypogonadism however on review of labs total testosterone level was 551.  Free testosterone level was low at 5.5 however he was informed that treatment guidelines for low testosterone are based on total and not free testosterone.  I offered to see him though based on his testosterone levels he is not a candidate for TRT.  He elected not to be seen.

## 2023-05-01 ENCOUNTER — Ambulatory Visit: Admission: RE | Admit: 2023-05-01 | Payer: 59 | Source: Home / Self Care | Admitting: Gastroenterology

## 2023-05-01 ENCOUNTER — Encounter: Admission: RE | Payer: Self-pay | Source: Home / Self Care

## 2023-05-01 SURGERY — COLONOSCOPY WITH PROPOFOL
Anesthesia: General

## 2023-08-14 ENCOUNTER — Telehealth: Payer: Self-pay | Admitting: Family Medicine

## 2023-08-14 ENCOUNTER — Other Ambulatory Visit: Payer: Self-pay

## 2023-08-14 DIAGNOSIS — I10 Essential (primary) hypertension: Secondary | ICD-10-CM

## 2023-08-14 NOTE — Telephone Encounter (Signed)
[facility] faxed refill request for the following medications:   olmesartan (BENICAR) 40 MG tablet     Please advise.

## 2023-08-15 ENCOUNTER — Telehealth: Payer: Self-pay | Admitting: Family Medicine

## 2023-08-15 MED ORDER — OLMESARTAN MEDOXOMIL 40 MG PO TABS: 40.0000 mg | ORAL_TABLET | Freq: Every day | ORAL | 0 refills | Status: DC

## 2023-08-15 NOTE — Telephone Encounter (Signed)
[facility] fax[facility] refill request for the following medications:   olmesartan (BENICAR) 40 MG tablet    Please advise

## 2023-08-16 ENCOUNTER — Other Ambulatory Visit: Payer: Self-pay

## 2023-08-16 DIAGNOSIS — I10 Essential (primary) hypertension: Secondary | ICD-10-CM

## 2023-09-07 ENCOUNTER — Encounter: Payer: Self-pay | Admitting: Family Medicine

## 2023-09-07 DIAGNOSIS — N411 Chronic prostatitis: Secondary | ICD-10-CM

## 2023-09-07 DIAGNOSIS — R350 Frequency of micturition: Secondary | ICD-10-CM

## 2023-09-12 MED ORDER — TADALAFIL 5 MG PO TABS
5.0000 mg | ORAL_TABLET | Freq: Every day | ORAL | 1 refills | Status: DC
Start: 2023-09-12 — End: 2024-03-13

## 2023-10-23 ENCOUNTER — Encounter: Payer: Self-pay | Admitting: Family Medicine

## 2023-10-23 ENCOUNTER — Ambulatory Visit (INDEPENDENT_AMBULATORY_CARE_PROVIDER_SITE_OTHER): Payer: 59 | Admitting: Family Medicine

## 2023-10-23 VITALS — BP 131/80 | HR 59 | Temp 97.7°F | Ht 72.0 in | Wt 239.0 lb

## 2023-10-23 DIAGNOSIS — Z1211 Encounter for screening for malignant neoplasm of colon: Secondary | ICD-10-CM

## 2023-10-23 DIAGNOSIS — I10 Essential (primary) hypertension: Secondary | ICD-10-CM | POA: Diagnosis not present

## 2023-10-23 DIAGNOSIS — Z0001 Encounter for general adult medical examination with abnormal findings: Secondary | ICD-10-CM | POA: Diagnosis not present

## 2023-10-23 DIAGNOSIS — F411 Generalized anxiety disorder: Secondary | ICD-10-CM | POA: Diagnosis not present

## 2023-10-23 DIAGNOSIS — R001 Bradycardia, unspecified: Secondary | ICD-10-CM | POA: Diagnosis not present

## 2023-10-23 DIAGNOSIS — Z720 Tobacco use: Secondary | ICD-10-CM

## 2023-10-23 DIAGNOSIS — R351 Nocturia: Secondary | ICD-10-CM

## 2023-10-23 DIAGNOSIS — R0683 Snoring: Secondary | ICD-10-CM

## 2023-10-23 DIAGNOSIS — Z0000 Encounter for general adult medical examination without abnormal findings: Secondary | ICD-10-CM

## 2023-10-23 MED ORDER — FLUOXETINE HCL 40 MG PO CAPS
40.0000 mg | ORAL_CAPSULE | Freq: Every day | ORAL | 3 refills | Status: AC
Start: 2023-10-23 — End: ?

## 2023-10-23 NOTE — Progress Notes (Signed)
 Annual Wellness Visit     Patient: Kyle Pollard, Male    DOB: 22-May-1961, 63 y.o.   MRN: [id number] Visit Date: 10/23/2023  Today's Provider: Carlean Charter, DO   Chief Complaint  Patient presents with   Annual Exam    Patient reports he does not sleep well most of the time. He wake to urinate and then gets thinking and can't get back to sleep.  He states he exercises every other day 1.5 mile walk and plays golf.  He eats regular, no specific diet.   Subjective    Kyle Pollard is a 63 y.o. male who presents today for his Annual Wellness Visit.   HPI Tylyn Blaustein "Kyle Pollard" is a 63 year old male who presents for a Medicare annual wellness visit.  He experiences poor sleep quality, which he attributes to his long-standing anxiety. He has been on fluoxetine  for anxiety but feels it may no longer be effective. He also experiences night sweats approximately once a week for about six months, sometimes waking up mildly sweaty, causing him to remove his covers, despite not using heavy bedding, and other times waking up drenched in sweat.  He is not aware of any associated anxious thoughts during these episodes.  He experiences nocturia, needing to urinate three to four times per night, disrupting his sleep. He has been using Cialis  to help with urination but notes difficulty initiating urination, a problem since his thirties. He has tried Flomax  and finasteride in the past without success. No pain or burning during urination, and he feels he fully empties his bladder. He has attempted timed voiding and drinking without improvement.  He mentions experiencing mental fatigue due to his new job at [employer], which he finds mentally tiring despite not being physically demanding. He denies feeling physically tired or sleepy during the day and reports no issues with daytime sleepiness in various situations.  He has a history of high blood pressure, managed with medication since his twenties. He also  reports sinus issues, particularly in the spring, but notes improvement recently. He experiences occasional dizziness when bending over and standing up. No chest pain or shortness of breath.  He has a history of arthritis, particularly in his shoulder, causing pain when extending arms horizontally upward and then reaching backward. He engages in a routine of walking and jogging to manage his symptoms. He also reports occasional swelling in his legs but not recently.  He has a history of prediabetes and has lost weight through exercise. He is interested in holistic approaches to prevent diabetes progression. He also chews tobacco, which he finds helps with his anxiety, although he had quit for a month and a half previously.    Medications: Outpatient Medications Prior to Visit  Medication Sig   meloxicam  (MOBIC ) 7.5 MG tablet Take 1 tablet (7.5 mg total) by mouth daily. Confirm OK to resume with ortho prior to start   olmesartan  (BENICAR ) 40 MG tablet Take 1 tablet (40 mg total) by mouth daily.   tadalafil  (CIALIS ) 5 MG tablet Take 1 tablet (5 mg total) by mouth daily.   [DISCONTINUED] FLUoxetine  (PROZAC ) 20 MG capsule Take 1 capsule (20 mg total) by mouth daily.   No facility-administered medications prior to visit.    Allergies  Allergen Reactions   Penicillins Rash    Patient Care Team: Yasuko Lapage, Asencion Blacksmith, DO as PCP - General (Family Medicine)  Review of Systems  Constitutional:  Negative for appetite change, chills, fatigue  and fever.  HENT:  Negative for congestion, ear pain, hearing loss, nosebleeds and trouble swallowing.   Eyes:  Negative for pain and visual disturbance.  Respiratory:  Negative for cough, chest tightness and shortness of breath.   Cardiovascular:  Negative for chest pain, palpitations and leg swelling.  Gastrointestinal:  Negative for abdominal pain, blood in stool, constipation, diarrhea, nausea and vomiting.  Endocrine: Negative for polydipsia, polyphagia and  polyuria.  Genitourinary:  Negative for dysuria and flank pain.  Musculoskeletal:  Positive for arthralgias (right shoulder). Negative for back pain, joint swelling, myalgias and neck stiffness.  Skin:  Negative for color change, rash and wound.  Neurological:  Positive for dizziness (mild after bending over). Negative for tremors, seizures, speech difficulty, weakness, light-headedness and headaches.  Psychiatric/Behavioral:  Negative for behavioral problems, confusion, decreased concentration, dysphoric mood and sleep disturbance. The patient is not nervous/anxious.   All other systems reviewed and are negative.        Objective    Vitals: BP 131/80 (BP Location: Left Arm, Patient Position: Sitting, Cuff Size: Normal)   Pulse (!) 59   Temp 97.7 F (36.5 C) (Oral)   Ht 6' (1.829 m)   Wt 239 lb (108.4 kg)   SpO2 100%   BMI 32.41 kg/m     Physical Exam Vitals and nursing note reviewed.  Constitutional:      General: He is awake.     Appearance: Normal appearance.  HENT:     Head: Normocephalic and atraumatic.     Right Ear: Tympanic membrane, ear canal and external ear normal.     Left Ear: Tympanic membrane, ear canal and external ear normal.     Nose: Nose normal.     Mouth/Throat:     Mouth: Mucous membranes are moist.     Pharynx: Oropharynx is clear. No oropharyngeal exudate or posterior oropharyngeal erythema.  Eyes:     General: No scleral icterus.    Extraocular Movements: Extraocular movements intact.     Conjunctiva/sclera: Conjunctivae normal.     Pupils: Pupils are equal, round, and reactive to light.  Neck:     Thyroid: No thyromegaly or thyroid tenderness.  Cardiovascular:     Rate and Rhythm: Regular rhythm. Bradycardia present.     Pulses: Normal pulses.     Heart sounds: Normal heart sounds.  Pulmonary:     Effort: Pulmonary effort is normal. No tachypnea, bradypnea or respiratory distress.     Breath sounds: Normal breath sounds. No stridor. No  wheezing, rhonchi or rales.  Abdominal:     General: Bowel sounds are normal. There is no distension.     Palpations: Abdomen is soft. There is no mass.     Tenderness: There is no abdominal tenderness. There is no guarding.     Hernia: No hernia is present.  Musculoskeletal:     Cervical back: Normal range of motion and neck supple.     Right lower leg: No edema.     Left lower leg: No edema.  Lymphadenopathy:     Cervical: No cervical adenopathy.  Skin:    General: Skin is warm and dry.  Neurological:     Mental Status: He is alert and oriented to person, place, and time. Mental status is at baseline.  Psychiatric:        Mood and Affect: Mood normal.        Behavior: Behavior normal.     Most recent functional status assessment:    10/23/2023  8:54 AM  In your present state of health, do you have any difficulty performing the following activities:  Hearing? 0  Vision? 0  Difficulty concentrating or making decisions? 0  Walking or climbing stairs? 0  Dressing or bathing? 0  Doing errands, shopping? 0   Most recent fall risk assessment:    10/23/2023    8:47 AM  Fall Risk   Falls in the past year? 0  Number falls in past yr: 0  Injury with Fall? 0    Most recent depression screenings:    10/23/2023    8:47 AM 03/08/2023   11:00 AM  PHQ 2/9 Scores  PHQ - 2 Score 0 0  PHQ- 9 Score 1    Most recent cognitive screening:    10/23/2023    9:43 AM  6CIT Screen  What Year? 0 points  What month? 0 points  What time? 0 points  Count back from 20 0 points  Months in reverse 0 points  Repeat phrase 0 points  Total Score 0 points   Most recent Audit-C alcohol use screening    10/23/2023    8:54 AM  Alcohol Use Disorder Test (AUDIT)  1. How often do you have a drink containing alcohol? 3  2. How many drinks containing alcohol do you have on a typical day when you are drinking? 1  3. How often do you have six or more drinks on one occasion? 0  AUDIT-C Score 4   4. How often during the last year have you found that you were not able to stop drinking once you had started? 0  5. How often during the last year have you failed to do what was normally expected from you because of drinking? 0  6. How often during the last year have you needed a first drink in the morning to get yourself going after a heavy drinking session? 0  7. How often during the last year have you had a feeling of guilt of remorse after drinking? 0  8. How often during the last year have you been unable to remember what happened the night before because you had been drinking? 0  9. Have you or someone else been injured as a result of your drinking? 0  10. Has a relative or friend or a doctor or another health worker been concerned about your drinking or suggested you cut down? 0  Alcohol Use Disorder Identification Test Final Score (AUDIT) 4   A score of 3 or more in women, and 4 or more in men indicates increased risk for alcohol abuse, EXCEPT if all of the points are from question 1   No results found for any visits on 10/23/23.  Assessment & Plan     Annual wellness visit done today including the all of the following: Reviewed patient's Family Medical History Reviewed and updated list of patient's medical providers Assessment of cognitive impairment was done Assessed patient's functional ability Established a written schedule for health screening services Health Risk Assessent Completed and Reviewed Patient is not currently on any opioid medications  Exercise Activities and Dietary recommendations  Goals   None     Immunization History  Administered Date(s) Administered   Influenza, Seasonal, Injecte, Preservative Fre 03/08/2023   Influenza,inj,Quad PF,6+ Mos 05/06/2022   Tdap 02/06/2019    Health Maintenance  Topic Date Due   Colonoscopy  03/10/2023   COVID-19 Vaccine (1 - 2024-25 season) 11/08/2023 (Originally 02/26/2023)   Zoster Vaccines- Shingrix (1  of 2)  01/22/2024 (Originally 10/12/2008)   Pneumonia Vaccine 99+ Years old (1 of 1 - PCV) 10/22/2024 (Originally 10/12/2008)   INFLUENZA VACCINE  01/26/2024   Medicare Annual Wellness (AWV)  10/22/2024   DTaP/Tdap/Td (2 - Td or Tdap) 02/05/2029   Hepatitis C Screening  Completed   HIV Screening  Completed   HPV VACCINES  Aged Out   Meningococcal B Vaccine  Aged Out     Discussed health benefits of physical activity, and encouraged him to engage in regular exercise appropriate for his age and condition.    Welcome to Medicare preventive visit -     EKG 12-Lead -     Visual acuity screening  Generalized anxiety disorder -     FLUoxetine  HCl; Take 1 capsule (40 mg total) by mouth daily.  Dispense: 90 capsule; Refill: 3  Essential hypertension -     EKG 12-Lead  Bradycardia  Nocturia -     Home sleep test  Loud snoring -     Home sleep test  Chewing tobacco use  Colon cancer screening -     Ambulatory referral to Gastroenterology     Welcome to Medicare exam Patient overdue for colonoscopy, due to cost concerns.  Referral will be sent over for patient to get more information about current cost, both in the hospital and at an outpatient facility.  Concerns about COVID-19 vaccine, interested in shingles vaccine. - Consider shingles vaccine at a future visit. - History of bradycardia; EKG ordered.  Sinus bradycardia EKG shows sinus bradycardia without ectopy or ST changes with rate of 50, PR interval 176, QRS 110, QT/QTc 472/430.  No prior available for comparison.  Generalized anxiety disorder Chronic anxiety with decreased fluoxetine  efficacy. Increasing dosage preferred due to past success. - Increase fluoxetine  dosage to 40 mg daily. - Monitor response over 4-8 weeks.  Night sweats Intermittent night sweats, potentially anxiety-related, particularly given that he wakes up during sleep with it and the intensity varies.  Reassess on follow-up.  Nocturia Longstanding  nocturia with partial improvement on Cialis , getting up up to 4 times per night to urinate.  Has also tried tamsulosin  and one other medication (finasteride?) without significant benefit.  Discussed potential sleep apnea. - Order home sleep study for sleep apnea evaluation.  Loud snoring Patient endorses loud snoring, that he does not wake himself up.  Concurrent with ongoing nocturia. - STOP-BANG score 5 (high) - ESS - 3 (low) - Order home sleep study.  Arthritis of shoulder Chronic shoulder pain likely due to arthritis, managed with exercise. - Consider physical therapy if pain worsens.  Hypertension Long-standing hypertension with current BP 131/80, higher at home. - Continue current antihypertensive medication. - Encourage home BP monitoring.  Recheck at next visit.  Prediabetes Managed with weight loss and exercise. - Continue regular exercise and healthy diet.  Allergic rhinitis Sinus issues and allergies with increased ear drainage. - Manage allergies with appropriate medications (daily Claritin, Zyrtec, Allegra, or Xyzal).  Chewing tobacco use Patient quit for over a month but restarted due to increasing anxiety.  Discussed that nicotine causes its own anxiety loop, with withdrawal creating increased anxiety, which is then improved with chewing additional tobacco, which makes it seem as though the tobacco helps the anxiety. - Encouraged stopping chewing tobacco again after reaching steady stated increased fluoxetine  dose.    Return in about 20 weeks (around 03/11/2024) for CPE, HTN.     I discussed the assessment and treatment plan with the patient  The patient was provided an opportunity to ask questions and all were answered. The patient agreed with the plan and demonstrated an understanding of the instructions.   The patient was advised to call back or seek an in-person evaluation if the symptoms worsen or if the condition fails to improve as anticipated.    Carlean Charter, DO  [facility] [phone] (phone) [phone] (fax)  [facility]

## 2023-11-10 ENCOUNTER — Other Ambulatory Visit: Payer: Self-pay | Admitting: Family Medicine

## 2023-11-10 DIAGNOSIS — I10 Essential (primary) hypertension: Secondary | ICD-10-CM

## 2024-03-08 ENCOUNTER — Encounter: Admitting: Family Medicine

## 2024-03-08 ENCOUNTER — Ambulatory Visit: Payer: Self-pay

## 2024-03-08 NOTE — Telephone Encounter (Signed)
 Patient triaged to UC.  FYI Only or Action Required?: FYI only for provider.  Patient was last seen in primary care on 10/23/2023 by Donzella Lauraine SAILOR, DO.  Called Nurse Triage reporting Dysuria.  Symptoms began several days ago.  Interventions attempted: Nothing.  Symptoms are: gradually worsening.  Triage Disposition: See Physician Within 24 Hours - going to UC  Patient/caregiver understands and will follow disposition?: Yes Reason for Disposition  Urinating more frequently than usual (i.e., frequency) OR new-onset of the feeling of an urgent need to urinate (i.e., urgency)  Answer Assessment - Initial Assessment Questions Patient's wife Karna was contacted regarding patient's complaint of dysuria. Hx of chronic urinary concerns, new onset of increased frequency began on 03/06/24. Patient's wife agreeable to UC visit and will relay to patient. ED precautions reviewed, pt verbalized understanding.  This RN provided Karna with the closest [facility] UC center to their home address as well as the website to check wait times/schedule once she speaks to her husband.  1. SYMPTOM: What's the main symptom you're concerned about? (e.g., frequency, incontinence)     Increased frequency  2. ONSET:     03/06/24  3. PAIN: Is there any pain? If Yes, ask: How bad is it? (Scale: 1-10; mild, moderate, severe)     Lower abdomen  4. CAUSE: What do you think is causing the symptoms?     Chronic hx of UTIs and dysuria  5. OTHER SYMPTOMS: Do you have any other symptoms? (e.g., blood in urine, fever, flank pain, pain with urination)     Denies all  Protocols used: Urinary Symptoms-A-AH Copied from CRM #[id number]. Topic: Clinical - Medication Question >> Mar 08, 2024  2:09 PM Yolanda T wrote: Reason for CRM: Karna patients wife called stated patient has a constant need to urinate but nothing happens. Pain in lower abdomen. Please f/u with spouse for medication as his appt was cancelled earlier  to day due to provider

## 2024-03-12 ENCOUNTER — Other Ambulatory Visit: Payer: Self-pay | Admitting: Family Medicine

## 2024-03-12 DIAGNOSIS — R350 Frequency of micturition: Secondary | ICD-10-CM

## 2024-03-12 DIAGNOSIS — N411 Chronic prostatitis: Secondary | ICD-10-CM

## 2024-03-19 ENCOUNTER — Encounter: Admission: RE | Payer: Self-pay | Source: Home / Self Care

## 2024-03-19 ENCOUNTER — Ambulatory Visit: Admission: RE | Admit: 2024-03-19 | Source: Home / Self Care | Admitting: Gastroenterology

## 2024-03-19 SURGERY — COLONOSCOPY
Anesthesia: General

## 2024-03-20 ENCOUNTER — Other Ambulatory Visit: Payer: Self-pay

## 2024-03-20 ENCOUNTER — Telehealth: Payer: Self-pay | Admitting: Family Medicine

## 2024-03-20 DIAGNOSIS — M150 Primary generalized (osteo)arthritis: Secondary | ICD-10-CM

## 2024-03-20 NOTE — Telephone Encounter (Signed)
 [facility] faxed refill request for the following medications:   meloxicam  (MOBIC ) 7.5 MG tablet    Please advise.

## 2024-03-20 NOTE — Telephone Encounter (Signed)
 Converted into a refill request

## 2024-03-21 ENCOUNTER — Encounter: Payer: Self-pay | Admitting: Family Medicine

## 2024-03-22 ENCOUNTER — Other Ambulatory Visit: Payer: Self-pay

## 2024-03-22 ENCOUNTER — Telehealth: Payer: Self-pay | Admitting: Family Medicine

## 2024-03-22 DIAGNOSIS — I10 Essential (primary) hypertension: Secondary | ICD-10-CM

## 2024-03-22 MED ORDER — OLMESARTAN MEDOXOMIL 40 MG PO TABS: 40.0000 mg | ORAL_TABLET | Freq: Every day | ORAL | 0 refills | Status: DC

## 2024-03-22 NOTE — Telephone Encounter (Signed)
 Converted

## 2024-03-22 NOTE — Telephone Encounter (Signed)
 [facility] faxed refill request for the following medications:   olmesartan (BENICAR) 40 MG tablet     Please advise.

## 2024-03-25 MED ORDER — MELOXICAM 7.5 MG PO TABS: 7.5000 mg | ORAL_TABLET | Freq: Every day | ORAL | 11 refills | Status: AC

## 2024-06-17 ENCOUNTER — Other Ambulatory Visit: Payer: Self-pay | Admitting: Family Medicine

## 2024-06-17 DIAGNOSIS — I10 Essential (primary) hypertension: Secondary | ICD-10-CM

## 2024-06-26 ENCOUNTER — Ambulatory Visit (INDEPENDENT_AMBULATORY_CARE_PROVIDER_SITE_OTHER): Admitting: Family Medicine

## 2024-06-26 ENCOUNTER — Encounter: Payer: Self-pay | Admitting: Family Medicine

## 2024-06-26 VITALS — BP 154/88 | HR 54 | Temp 97.5°F | Ht 72.0 in | Wt 238.4 lb

## 2024-06-26 DIAGNOSIS — Z125 Encounter for screening for malignant neoplasm of prostate: Secondary | ICD-10-CM

## 2024-06-26 DIAGNOSIS — Z136 Encounter for screening for cardiovascular disorders: Secondary | ICD-10-CM

## 2024-06-26 DIAGNOSIS — R7303 Prediabetes: Secondary | ICD-10-CM

## 2024-06-26 DIAGNOSIS — Z0000 Encounter for general adult medical examination without abnormal findings: Secondary | ICD-10-CM

## 2024-06-26 DIAGNOSIS — N401 Enlarged prostate with lower urinary tract symptoms: Secondary | ICD-10-CM | POA: Insufficient documentation

## 2024-06-26 DIAGNOSIS — Z23 Encounter for immunization: Secondary | ICD-10-CM | POA: Diagnosis not present

## 2024-06-26 DIAGNOSIS — Z0001 Encounter for general adult medical examination with abnormal findings: Secondary | ICD-10-CM

## 2024-06-26 DIAGNOSIS — I10 Essential (primary) hypertension: Secondary | ICD-10-CM

## 2024-06-26 DIAGNOSIS — R350 Frequency of micturition: Secondary | ICD-10-CM | POA: Diagnosis not present

## 2024-06-26 MED ORDER — TADALAFIL 5 MG PO TABS: 5.0000 mg | ORAL_TABLET | Freq: Every day | ORAL | 1 refills | Status: AC

## 2024-06-26 MED ORDER — DUTASTERIDE 0.5 MG PO CAPS: 0.5000 mg | ORAL_CAPSULE | Freq: Every day | ORAL | 3 refills | Status: AC

## 2024-06-26 NOTE — Progress Notes (Signed)
 "    Complete physical exam   Patient: Kyle Pollard   DOB: December 19, 1960   63 y.o. Male  MRN: [id number] Visit Date: 06/26/2024  Today's healthcare provider: LAURAINE LOISE BUOY, DO   Chief Complaint  Patient presents with   Annual Exam    Diet- General Exercise- Golfing and walking Overall feeling- Pretty good Sleep- Pretty good except when waking up to urinate Concerns- Not really  Colonoscopy- declined  Flu shot- yes   Subjective    Kyle Pollard is a 63 y.o. male who presents today for a complete physical exam.   HPI HPI     Annual Exam    Additional comments: Diet- General Exercise- Golfing and walking Overall feeling- Pretty good Sleep- Pretty good except when waking up to urinate Concerns- Not really  Colonoscopy- declined  Flu shot- yes      Last edited by Terrel Powell CROME, CMA on 06/26/2024 10:24 AM.       Kyle Pollard is a 63 year old male who presents with urinary issues, including frequent nighttime urination and incomplete bladder emptying.  He has experienced urinary issues for a long time, which have worsened recently. Symptoms include urinary leakage, nocturia, and a sensation of incomplete bladder emptying. He previously tried Flomax  but discontinued it due to adverse effects on sexual function. Currently, he wakes up every two hours at night to urinate and is seeking options to manage these symptoms.  He has a history of using tadalafil , which initially helped with his symptoms, but its effectiveness has diminished over time. He drinks a lot of fluids, which contributes to his frequent urination.  Occasional nasal congestion is noted, which he has experienced throughout his life and is not concerned about.  He has a history of polyps and has not pursued a colonoscopy due to cost concerns. He is aware of his borderline diabetic status and monitors his cholesterol, which he believes is well-managed. He has lost weight and was previously jogging  regularly, but his physical activity has decreased during the winter months.  Socially, he is retired but works part-time at [employer] in [place] and at [employer] course. He chews tobacco and has quit several times in the past, but resumes during periods of stress. He has no family history of prostate cancer and prefers to have regular prostate screening antigens done.    Past Medical History:  Diagnosis Date   Allergy    Anxiety    Arthritis    Hypertension    Past Surgical History:  Procedure Laterality Date   Broken Leg  1992   CERVICAL FUSION     COLONOSCOPY WITH PROPOFOL  N/A 03/09/2020   Procedure: COLONOSCOPY WITH PROPOFOL ;  Surgeon: Therisa Bi, MD;  Location: [facility] ENDOSCOPY;  Service: Gastroenterology;  Laterality: N/A;  Went to [facility] today 9/10 for C-19 test about noon   SPINE SURGERY     VASECTOMY     Social History   Socioeconomic History   Marital status: Married    Spouse name: Not on file   Number of children: Not on file   Years of education: Not on file   Highest education level: Associate degree: occupational, scientist, product/process development, or vocational program  Occupational History   Not on file  Tobacco Use   Smoking status: Never   Smokeless tobacco: Current    Types: Chew   Tobacco comments:    Chewing tobacco- 2 cans/week; buys in bulk  Vaping Use   Vaping status: Never Used  Substance and Sexual Activity   Alcohol use: Yes    Alcohol/week: 14.0 standard drinks of alcohol    Types: 14 Cans of beer per week   Drug use: Never   Sexual activity: Not Currently    Birth control/protection: Surgical  Other Topics Concern   Not on file  Social History Narrative   Not on file   Social Drivers of Health   Tobacco Use: High Risk (06/26/2024)   Patient History    Smoking Tobacco Use: Never    Smokeless Tobacco Use: Current    Passive Exposure: Not on file  Financial Resource Strain: Low Risk (03/07/2024)   Overall Financial Resource Strain (CARDIA)     Difficulty of Paying Living Expenses: Not hard at all  Food Insecurity: No Food Insecurity (03/07/2024)   Epic    Worried About Programme Researcher, Broadcasting/film/video in the Last Year: Never true    Ran Out of Food in the Last Year: Never true  Transportation Needs: No Transportation Needs (03/07/2024)   Epic    Lack of Transportation (Medical): No    Lack of Transportation (Non-Medical): No  Physical Activity: Insufficiently Active (03/07/2024)   Exercise Vital Sign    Days of Exercise per Week: 4 days    Minutes of Exercise per Session: 30 min  Stress: No Stress Concern Present (03/07/2024)   [employer] of Occupational Health - Occupational Stress Questionnaire    Feeling of Stress: Only a little  Social Connections: Socially Integrated (03/07/2024)   Social Connection and Isolation Panel    Frequency of Communication with Friends and Family: More than three times a week    Frequency of Social Gatherings with Friends and Family: Three times a week    Attends Religious Services: 1 to 4 times per year    Active Member of Clubs or Organizations: Yes    Attends Banker Meetings: 1 to 4 times per year    Marital Status: Married  Catering Manager Violence: Not At Risk (06/26/2024)   Epic    Fear of Current or Ex-Partner: No    Emotionally Abused: No    Physically Abused: No    Sexually Abused: No  Depression (PHQ2-9): Low Risk (06/26/2024)   Depression (PHQ2-9)    PHQ-2 Score: 0  Alcohol Screen: Low Risk (03/07/2024)   Alcohol Screen    Last Alcohol Screening Score (AUDIT): 4  Housing: Low Risk (03/07/2024)   Epic    Unable to Pay for Housing in the Last Year: No    Number of Times Moved in the Last Year: 0    Homeless in the Last Year: No  Utilities: Not At Risk (10/23/2023)   AHC Utilities    Threatened with loss of utilities: No  Health Literacy: Adequate Health Literacy (06/26/2024)   B1300 Health Literacy    Frequency of need for help with medical instructions: Never    Family Status  Relation Name Status   Mother  Deceased   Father  Deceased   Neg Hx  (Not Specified)  No partnership data on file   Family History  Problem Relation Age of Onset   Dementia Mother    Prostate cancer Neg Hx    Bladder Cancer Neg Hx    Kidney cancer Neg Hx    Allergies[1]  Patient Care Team: Uziah Sorter, Lauraine SAILOR, DO as PCP - General (Family Medicine)   Medications: Show/hide medication list[2]  Review of Systems  Constitutional:  Negative for appetite change, chills, fatigue and  fever.  HENT:  Negative for congestion, ear pain, hearing loss, nosebleeds and trouble swallowing.   Eyes:  Negative for pain and visual disturbance.  Respiratory:  Negative for cough, chest tightness and shortness of breath.   Cardiovascular:  Negative for chest pain, palpitations and leg swelling.  Gastrointestinal:  Negative for abdominal pain, blood in stool, constipation, diarrhea, nausea and vomiting.  Endocrine: Negative for polydipsia, polyphagia and polyuria.  Genitourinary:  Positive for decreased urine volume, difficulty urinating (retention) and frequency. Negative for dysuria and flank pain.  Musculoskeletal:  Negative for arthralgias, back pain, joint swelling, myalgias and neck stiffness.  Skin:  Negative for color change, rash and wound.  Neurological:  Negative for dizziness, tremors, seizures, speech difficulty, weakness, light-headedness and headaches.  Psychiatric/Behavioral:  Negative for behavioral problems, confusion, decreased concentration, dysphoric mood and sleep disturbance. The patient is not nervous/anxious.   All other systems reviewed and are negative.      Objective    BP (!) 154/88 (BP Location: Left Arm, Cuff Size: Large)   Pulse (!) 54   Temp (!) 97.5 F (36.4 C) (Oral)   Ht 6' (1.829 m)   Wt 238 lb 6.4 oz (108.1 kg)   SpO2 98%   BMI 32.33 kg/m    Physical Exam Vitals and nursing note reviewed.  Constitutional:      General: He is awake.      Appearance: Normal appearance.  HENT:     Head: Normocephalic and atraumatic.     Right Ear: Tympanic membrane, ear canal and external ear normal.     Left Ear: Tympanic membrane, ear canal and external ear normal.     Nose: Nose normal.     Mouth/Throat:     Mouth: Mucous membranes are moist.     Pharynx: Oropharynx is clear. No oropharyngeal exudate or posterior oropharyngeal erythema.  Eyes:     General: No scleral icterus.    Extraocular Movements: Extraocular movements intact.     Conjunctiva/sclera: Conjunctivae normal.     Pupils: Pupils are equal, round, and reactive to light.  Neck:     Thyroid: No thyromegaly or thyroid tenderness.  Cardiovascular:     Rate and Rhythm: Normal rate and regular rhythm.     Pulses: Normal pulses.     Heart sounds: Normal heart sounds.  Pulmonary:     Effort: Pulmonary effort is normal. No tachypnea, bradypnea or respiratory distress.     Breath sounds: Normal breath sounds. No stridor. No wheezing, rhonchi or rales.  Abdominal:     General: Bowel sounds are normal. There is no distension.     Palpations: Abdomen is soft. There is no mass.     Tenderness: There is no abdominal tenderness. There is no guarding.     Hernia: No hernia is present.  Musculoskeletal:     Cervical back: Normal range of motion and neck supple.     Right lower leg: No edema.     Left lower leg: No edema.  Lymphadenopathy:     Cervical: No cervical adenopathy.  Skin:    General: Skin is warm and dry.  Neurological:     Mental Status: He is alert and oriented to person, place, and time. Mental status is at baseline.  Psychiatric:        Mood and Affect: Mood normal.        Behavior: Behavior normal.      Last depression screening scores    06/26/2024   10:37 AM 10/23/2023  8:47 AM 03/08/2023   11:00 AM  PHQ 2/9 Scores  PHQ - 2 Score 0 0 0  PHQ- 9 Score 0 1       Data saved with a previous flowsheet row definition   Last fall risk screening     06/26/2024   10:37 AM  Fall Risk   Falls in the past year? 0  Number falls in past yr: 0  Injury with Fall? 0  Risk for fall due to : No Fall Risks   Last Audit-C alcohol use screening    03/07/2024    8:41 AM  Alcohol Use Disorder Test (AUDIT)  1. How often do you have a drink containing alcohol? 3   2. How many drinks containing alcohol do you have on a typical day when you are drinking? 0   3. How often do you have six or more drinks on one occasion? 1   AUDIT-C Score 4   4. How often during the last year have you found that you were not able to stop drinking once you had started? 0   5. How often during the last year have you failed to do what was normally expected from you because of drinking? 0   6. How often during the last year have you needed a first drink in the morning to get yourself going after a heavy drinking session? 0   7. How often during the last year have you had a feeling of guilt of remorse after drinking? 0   8. How often during the last year have you been unable to remember what happened the night before because you had been drinking? 0   9. Have you or someone else been injured as a result of your drinking? 0   10. Has a relative or friend or a doctor or another health worker been concerned about your drinking or suggested you cut down? 0   Alcohol Use Disorder Identification Test Final Score (AUDIT) 4      Manually entered by patient   A score of 3 or more in women, and 4 or more in men indicates increased risk for alcohol abuse, EXCEPT if all of the points are from question 1   No results found for any visits on 06/26/24.  Assessment & Plan    Routine Health Maintenance and Physical Exam  Exercise Activities and Dietary recommendations  Goals   None     Immunization History  Administered Date(s) Administered   INFLUENZA, HIGH DOSE SEASONAL PF 06/26/2024   Influenza, Seasonal, Injecte, Preservative Fre 03/08/2023   Influenza,inj,Quad PF,6+ Mos  05/06/2022   Tdap 10/23/2013, 02/06/2019    Health Maintenance  Topic Date Due   Zoster Vaccines- Shingrix (1 of 2) 09/24/2024 (Originally 10/12/2008)   Pneumococcal Vaccine: 50+ Years (1 of 1 - PCV) 10/22/2024 (Originally 10/12/2008)   COVID-19 Vaccine (1 - 2025-26 season) 02/24/2025 (Originally 02/26/2024)   Colonoscopy  06/26/2025 (Originally 03/10/2023)   Medicare Annual Wellness (AWV)  10/22/2024   DTaP/Tdap/Td (3 - Td or Tdap) 02/05/2029   Influenza Vaccine  Completed   Hepatitis C Screening  Completed   HIV Screening  Completed   Hepatitis B Vaccines 19-59 Average Risk  Aged Out   Meningococcal B Vaccine  Aged Out    Discussed health benefits of physical activity, and encouraged him to engage in regular exercise appropriate for his age and condition.   Annual physical exam  Benign prostatic hyperplasia with urinary frequency -  Tadalafil ; Take 1 tablet (5 mg total) by mouth daily.  Dispense: 90 tablet; Refill: 1 -     Dutasteride; Take 1 capsule (0.5 mg total) by mouth daily.  Dispense: 30 capsule; Refill: 3  Essential hypertension -     Microalbumin / creatinine urine ratio -     Comprehensive metabolic panel with GFR  Prediabetes -     Hemoglobin A1c  Need for influenza vaccination -     Flu vaccine HIGH DOSE PF(Fluzone Trivalent)  Encounter for screening for cardiovascular disorders -     Lipid panel  Prostate cancer screening -     PSA Total (Reflex To Free)      Annual physical exam Physical exam overall unremarkable except as noted above. Routine lab work ordered as noted.  Discussed vaccinations and screenings. Declined shingles vaccine and colonoscopy. Discussed prostate screening and cardiovascular risk assessment. - Administered flu shot. - Consider shingles vaccine at age 77 (later dose per pt preference). - Consider coronary calcium  CT score for cardiovascular risk assessment. - Ordered prostate screening antigen test.  Benign prostatic  hyperplasia with lower urinary tract symptoms Chronic urinary symptoms worsening with urinary frequency, dribbling, nocturia and urinary retention. Flomax  not tolerated, tadalafil  becoming less effective. No dysuria or hematuria. - Prescribed dutasteride with caution for hypotension when combined with tadalafil . - Continue tadalafil  as prescribed.  Essential hypertension Chronic.  Blood pressure slightly elevated today, home readings stable at 132/85 mmHg. - Continue home blood pressure monitoring. - Will not adjust today due to potential for hypotension with combination of dutasteride and tadalafil . - Reassess at follow-up.  Prediabetes Borderline diabetic status, managed with lifestyle modifications.      Return in about 6 months (around 12/24/2024) for HTN, Chronic f/u.     I discussed the assessment and treatment plan with the patient  The patient was provided an opportunity to ask questions and all were answered. The patient agreed with the plan and demonstrated an understanding of the instructions.   The patient was advised to call back or seek an in-person evaluation if the symptoms worsen or if the condition fails to improve as anticipated.    LAURAINE LOISE BUOY, DO  [facility] [phone] (phone) [phone] (fax)  [facility]    [1] No Active Allergies [2]  Outpatient Medications Prior to Visit  Medication Sig   FLUoxetine  (PROZAC ) 40 MG capsule Take 1 capsule (40 mg total) by mouth daily.   meloxicam  (MOBIC ) 7.5 MG tablet Take 1 tablet (7.5 mg total) by mouth daily. Confirm OK to resume with ortho prior to start   olmesartan  (BENICAR ) 40 MG tablet TAKE 1 TABLET BY MOUTH DAILY   [DISCONTINUED] tadalafil  (CIALIS ) 5 MG tablet TAKE 1 TABLET BY MOUTH DAILY   No facility-administered medications prior to visit.   "

## 2024-06-27 LAB — COMPREHENSIVE METABOLIC PANEL WITH GFR
ALT: 15 IU/L (ref 0–44)
AST: 20 IU/L (ref 0–40)
Albumin: 4.5 g/dL (ref 3.9–4.9)
Alkaline Phosphatase: 57 IU/L (ref 47–123)
BUN/Creatinine Ratio: 19 (ref 10–24)
BUN: 14 mg/dL (ref 8–27)
Bilirubin Total: 0.5 mg/dL (ref 0.0–1.2)
CO2: 25 mmol/L (ref 20–29)
Calcium: 9.5 mg/dL (ref 8.6–10.2)
Chloride: 103 mmol/L (ref 96–106)
Creatinine, Ser: 0.72 mg/dL — ABNORMAL LOW (ref 0.76–1.27)
Globulin, Total: 2.9 g/dL (ref 1.5–4.5)
Glucose: 100 mg/dL — ABNORMAL HIGH (ref 70–99)
Potassium: 4.7 mmol/L (ref 3.5–5.2)
Sodium: 142 mmol/L (ref 134–144)
Total Protein: 7.4 g/dL (ref 6.0–8.5)
eGFR: 101 mL/min/1.73

## 2024-06-27 LAB — LIPID PANEL
Chol/HDL Ratio: 2.8 ratio (ref 0.0–5.0)
Cholesterol, Total: 185 mg/dL (ref 100–199)
HDL: 65 mg/dL
LDL Chol Calc (NIH): 105 mg/dL — ABNORMAL HIGH (ref 0–99)
Triglycerides: 84 mg/dL (ref 0–149)
VLDL Cholesterol Cal: 15 mg/dL (ref 5–40)

## 2024-06-27 LAB — PSA TOTAL (REFLEX TO FREE): Prostate Specific Ag, Serum: 0.8 ng/mL (ref 0.0–4.0)

## 2024-06-27 LAB — HEMOGLOBIN A1C
Est. average glucose Bld gHb Est-mCnc: 117 mg/dL
Hgb A1c MFr Bld: 5.7 % — ABNORMAL HIGH (ref 4.8–5.6)

## 2024-07-06 ENCOUNTER — Ambulatory Visit: Payer: Self-pay | Admitting: Family Medicine
